# Patient Record
Sex: Female | Born: 1955 | Race: White | Hispanic: No | Marital: Married | Smoking: Former smoker
Health system: Southern US, Community
[De-identification: ages and names within clinical notes are randomized; demographics above are authoritative.]

## PROBLEM LIST (undated history)

## (undated) DIAGNOSIS — K219 Gastro-esophageal reflux disease without esophagitis: Secondary | ICD-10-CM

## (undated) DIAGNOSIS — L299 Pruritus, unspecified: Secondary | ICD-10-CM

## (undated) DIAGNOSIS — H04129 Dry eye syndrome of unspecified lacrimal gland: Secondary | ICD-10-CM

## (undated) DIAGNOSIS — H269 Unspecified cataract: Secondary | ICD-10-CM

## (undated) DIAGNOSIS — G4700 Insomnia, unspecified: Secondary | ICD-10-CM

## (undated) HISTORY — DX: Pruritus, unspecified: L29.9

## (undated) HISTORY — DX: Hemochromatosis, unspecified: E83.119

## (undated) HISTORY — DX: Insomnia, unspecified: G47.00

## (undated) HISTORY — DX: Unspecified cataract: H26.9

## (undated) HISTORY — DX: Gastro-esophageal reflux disease without esophagitis: K21.9

## (undated) HISTORY — DX: Dry eye syndrome of unspecified lacrimal gland: H04.129

## (undated) HISTORY — PX: FIXATION KYPHOPLASTY: SHX860

---
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
## 1995-12-30 HISTORY — PX: KNEE SURGERY: SHX244

## 1998-12-29 HISTORY — PX: ABDOMINAL HYSTERECTOMY: SHX81

## 1999-12-30 HISTORY — PX: OOPHORECTOMY: SHX86

## 2003-12-30 LAB — CONVERTED CEMR LAB

## 2007-10-14 ENCOUNTER — Encounter: Payer: Self-pay | Admitting: Family Medicine

## 2007-10-14 LAB — CONVERTED CEMR LAB
LDL Cholesterol: 102 mg/dL
TSH: 0.011 microintl units/mL

## 2007-11-01 ENCOUNTER — Ambulatory Visit: Payer: Self-pay | Admitting: Family Medicine

## 2007-11-01 DIAGNOSIS — E039 Hypothyroidism, unspecified: Secondary | ICD-10-CM | POA: Insufficient documentation

## 2007-11-02 ENCOUNTER — Encounter: Admission: RE | Admit: 2007-11-02 | Discharge: 2007-11-02 | Payer: Self-pay | Admitting: Family Medicine

## 2007-11-09 ENCOUNTER — Encounter: Payer: Self-pay | Admitting: Family Medicine

## 2007-11-09 DIAGNOSIS — G43109 Migraine with aura, not intractable, without status migrainosus: Secondary | ICD-10-CM | POA: Insufficient documentation

## 2007-11-12 ENCOUNTER — Encounter: Admission: RE | Admit: 2007-11-12 | Discharge: 2007-11-12 | Payer: Self-pay | Admitting: Family Medicine

## 2007-11-12 ENCOUNTER — Ambulatory Visit: Payer: Self-pay | Admitting: Family Medicine

## 2007-11-12 DIAGNOSIS — J441 Chronic obstructive pulmonary disease with (acute) exacerbation: Secondary | ICD-10-CM | POA: Insufficient documentation

## 2007-11-12 DIAGNOSIS — J449 Chronic obstructive pulmonary disease, unspecified: Secondary | ICD-10-CM | POA: Insufficient documentation

## 2007-11-12 DIAGNOSIS — I209 Angina pectoris, unspecified: Secondary | ICD-10-CM

## 2007-11-23 ENCOUNTER — Encounter: Payer: Self-pay | Admitting: Family Medicine

## 2007-11-29 ENCOUNTER — Ambulatory Visit: Payer: Self-pay | Admitting: Family Medicine

## 2007-11-29 DIAGNOSIS — M25519 Pain in unspecified shoulder: Secondary | ICD-10-CM | POA: Insufficient documentation

## 2008-02-02 ENCOUNTER — Ambulatory Visit: Payer: Self-pay | Admitting: Family Medicine

## 2008-02-02 DIAGNOSIS — R7301 Impaired fasting glucose: Secondary | ICD-10-CM

## 2008-02-02 LAB — CONVERTED CEMR LAB
Blood Glucose, Fasting: 105 mg/dL
Microalbumin U total vol: 10 mg/L
Nitrite: NEGATIVE
Urobilinogen, UA: 0.2
pH: 7

## 2008-02-03 ENCOUNTER — Encounter: Payer: Self-pay | Admitting: Family Medicine

## 2008-02-04 LAB — CONVERTED CEMR LAB
Calcium: 9.6 mg/dL (ref 8.4–10.5)
Chloride: 103 meq/L (ref 96–112)
Cholesterol: 212 mg/dL — ABNORMAL HIGH (ref 0–200)
Creatinine, Ser: 0.66 mg/dL (ref 0.40–1.20)
HDL goal, serum: 40 mg/dL
LDL Goal: 70 mg/dL
Potassium: 4.7 meq/L (ref 3.5–5.3)
Sodium: 139 meq/L (ref 135–145)
Total Bilirubin: 0.5 mg/dL (ref 0.3–1.2)
Total CHOL/HDL Ratio: 3.9
Triglycerides: 108 mg/dL (ref <150)

## 2008-02-10 ENCOUNTER — Telehealth (INDEPENDENT_AMBULATORY_CARE_PROVIDER_SITE_OTHER): Payer: Self-pay | Admitting: *Deleted

## 2008-02-10 ENCOUNTER — Encounter: Payer: Self-pay | Admitting: Family Medicine

## 2008-02-15 ENCOUNTER — Ambulatory Visit: Payer: Self-pay | Admitting: Family Medicine

## 2008-03-31 ENCOUNTER — Ambulatory Visit: Payer: Self-pay | Admitting: Family Medicine

## 2008-04-21 ENCOUNTER — Encounter: Payer: Self-pay | Admitting: Family Medicine

## 2008-04-24 ENCOUNTER — Telehealth (INDEPENDENT_AMBULATORY_CARE_PROVIDER_SITE_OTHER): Payer: Self-pay | Admitting: *Deleted

## 2008-04-27 ENCOUNTER — Telehealth: Payer: Self-pay | Admitting: Family Medicine

## 2008-05-04 ENCOUNTER — Encounter: Payer: Self-pay | Admitting: Family Medicine

## 2008-05-15 ENCOUNTER — Ambulatory Visit: Payer: Self-pay | Admitting: Family Medicine

## 2008-05-15 ENCOUNTER — Encounter: Admission: RE | Admit: 2008-05-15 | Discharge: 2008-05-15 | Payer: Self-pay | Admitting: Family Medicine

## 2008-05-15 DIAGNOSIS — F17200 Nicotine dependence, unspecified, uncomplicated: Secondary | ICD-10-CM

## 2008-05-15 DIAGNOSIS — E042 Nontoxic multinodular goiter: Secondary | ICD-10-CM

## 2008-05-16 LAB — CONVERTED CEMR LAB
T3, Free: 3.8 pg/mL (ref 2.3–4.2)
TSH: 0.006 microintl units/mL — ABNORMAL LOW (ref 0.350–5.50)

## 2008-05-24 ENCOUNTER — Encounter: Payer: Self-pay | Admitting: Family Medicine

## 2008-05-25 ENCOUNTER — Ambulatory Visit: Payer: Self-pay | Admitting: Family Medicine

## 2008-05-25 DIAGNOSIS — R498 Other voice and resonance disorders: Secondary | ICD-10-CM | POA: Insufficient documentation

## 2008-05-25 DIAGNOSIS — G4700 Insomnia, unspecified: Secondary | ICD-10-CM | POA: Insufficient documentation

## 2008-06-01 ENCOUNTER — Telehealth: Payer: Self-pay | Admitting: Family Medicine

## 2008-06-02 ENCOUNTER — Telehealth: Payer: Self-pay | Admitting: Family Medicine

## 2008-06-15 ENCOUNTER — Ambulatory Visit: Payer: Self-pay | Admitting: Family Medicine

## 2008-06-15 DIAGNOSIS — M7710 Lateral epicondylitis, unspecified elbow: Secondary | ICD-10-CM | POA: Insufficient documentation

## 2008-06-28 ENCOUNTER — Telehealth: Payer: Self-pay | Admitting: Family Medicine

## 2008-06-29 ENCOUNTER — Telehealth: Payer: Self-pay | Admitting: Family Medicine

## 2008-08-10 ENCOUNTER — Ambulatory Visit: Payer: Self-pay | Admitting: Family Medicine

## 2008-08-14 ENCOUNTER — Telehealth: Payer: Self-pay | Admitting: Family Medicine

## 2008-08-16 ENCOUNTER — Telehealth: Payer: Self-pay | Admitting: Family Medicine

## 2008-08-17 ENCOUNTER — Telehealth: Payer: Self-pay | Admitting: Family Medicine

## 2008-08-21 ENCOUNTER — Telehealth: Payer: Self-pay | Admitting: Family Medicine

## 2008-09-27 ENCOUNTER — Ambulatory Visit: Payer: Self-pay | Admitting: Family Medicine

## 2008-09-27 DIAGNOSIS — R208 Other disturbances of skin sensation: Secondary | ICD-10-CM | POA: Insufficient documentation

## 2008-10-10 ENCOUNTER — Telehealth: Payer: Self-pay | Admitting: Family Medicine

## 2008-10-19 ENCOUNTER — Telehealth: Payer: Self-pay | Admitting: Family Medicine

## 2008-10-20 ENCOUNTER — Telehealth (INDEPENDENT_AMBULATORY_CARE_PROVIDER_SITE_OTHER): Payer: Self-pay | Admitting: Internal Medicine

## 2008-10-26 ENCOUNTER — Telehealth: Payer: Self-pay | Admitting: Family Medicine

## 2008-10-26 ENCOUNTER — Ambulatory Visit: Payer: Self-pay | Admitting: Family Medicine

## 2008-11-16 ENCOUNTER — Telehealth: Payer: Self-pay | Admitting: Family Medicine

## 2008-11-17 ENCOUNTER — Ambulatory Visit: Payer: Self-pay | Admitting: Family Medicine

## 2008-11-22 ENCOUNTER — Telehealth (INDEPENDENT_AMBULATORY_CARE_PROVIDER_SITE_OTHER): Payer: Self-pay | Admitting: *Deleted

## 2008-11-24 ENCOUNTER — Telehealth: Payer: Self-pay | Admitting: Family Medicine

## 2008-11-27 ENCOUNTER — Ambulatory Visit: Payer: Self-pay | Admitting: Family Medicine

## 2008-11-27 ENCOUNTER — Encounter: Admission: RE | Admit: 2008-11-27 | Discharge: 2008-11-27 | Payer: Self-pay | Admitting: Family Medicine

## 2008-11-27 DIAGNOSIS — M545 Low back pain, unspecified: Secondary | ICD-10-CM | POA: Insufficient documentation

## 2008-11-28 LAB — CONVERTED CEMR LAB
Anti Nuclear Antibody(ANA): NEGATIVE
Folate: >20 ng/mL
Hemoglobin: 15.6 g/dL — ABNORMAL HIGH (ref 12.0–15.0)
Platelets: 388 10*3/uL (ref 150–400)
Sed Rate: 9 mm/hr (ref 0–22)
TSH: 0.142 microintl units/mL — ABNORMAL LOW (ref 0.350–4.50)
Vitamin B-12: 820 pg/mL (ref 211–911)

## 2008-12-02 ENCOUNTER — Encounter: Admission: RE | Admit: 2008-12-02 | Discharge: 2008-12-02 | Payer: Self-pay | Admitting: Family Medicine

## 2008-12-29 HISTORY — PX: OTHER SURGICAL HISTORY: SHX169

## 2009-01-04 ENCOUNTER — Encounter: Payer: Self-pay | Admitting: Family Medicine

## 2009-02-01 ENCOUNTER — Encounter: Payer: Self-pay | Admitting: Family Medicine

## 2009-03-01 ENCOUNTER — Encounter: Payer: Self-pay | Admitting: Family Medicine

## 2009-04-12 ENCOUNTER — Encounter: Payer: Self-pay | Admitting: Family Medicine

## 2009-04-23 ENCOUNTER — Ambulatory Visit: Payer: Self-pay | Admitting: Family Medicine

## 2009-04-25 LAB — CONVERTED CEMR LAB: Anti Nuclear Antibody(ANA): NEGATIVE

## 2009-05-22 ENCOUNTER — Telehealth: Payer: Self-pay | Admitting: Family Medicine

## 2009-05-25 ENCOUNTER — Encounter: Payer: Self-pay | Admitting: Family Medicine

## 2009-06-08 ENCOUNTER — Encounter: Payer: Self-pay | Admitting: Family Medicine

## 2010-01-17 ENCOUNTER — Encounter: Payer: Self-pay | Admitting: Family Medicine

## 2010-02-14 ENCOUNTER — Encounter: Payer: Self-pay | Admitting: Family Medicine

## 2010-02-14 ENCOUNTER — Telehealth: Payer: Self-pay | Admitting: Family Medicine

## 2010-02-14 DIAGNOSIS — M2550 Pain in unspecified joint: Secondary | ICD-10-CM

## 2010-02-15 ENCOUNTER — Encounter: Payer: Self-pay | Admitting: Family Medicine

## 2010-02-19 LAB — CONVERTED CEMR LAB
CRP: 0.3 mg/dL (ref <0.6)
Cytomegalovirus Ab-IgG: >10 — ABNORMAL HIGH (ref <0.4)
EBV NA IgG: 2.86 — ABNORMAL HIGH
EBV VCA IgM: 0.04
HCT: 47.4 % — ABNORMAL HIGH (ref 36.0–46.0)
MCHC: 32.3 g/dL (ref 30.0–36.0)
Platelets: 337 10*3/uL (ref 150–400)
RBC: 4.8 M/uL (ref 3.87–5.11)
Rhuematoid fact SerPl-aCnc: <20 intl units/mL (ref 0–20)
WBC: 9 10*3/uL (ref 4.0–10.5)

## 2010-02-21 ENCOUNTER — Ambulatory Visit: Payer: Self-pay | Admitting: Family Medicine

## 2010-02-21 DIAGNOSIS — M79609 Pain in unspecified limb: Secondary | ICD-10-CM

## 2010-02-26 ENCOUNTER — Ambulatory Visit: Payer: Self-pay | Admitting: Family Medicine

## 2010-02-26 DIAGNOSIS — H6690 Otitis media, unspecified, unspecified ear: Secondary | ICD-10-CM | POA: Insufficient documentation

## 2010-03-04 ENCOUNTER — Telehealth: Payer: Self-pay | Admitting: Family Medicine

## 2010-03-07 ENCOUNTER — Telehealth: Payer: Self-pay | Admitting: Family Medicine

## 2010-03-08 ENCOUNTER — Telehealth: Payer: Self-pay | Admitting: Family Medicine

## 2010-03-08 ENCOUNTER — Encounter: Payer: Self-pay | Admitting: Family Medicine

## 2010-03-12 ENCOUNTER — Telehealth: Payer: Self-pay | Admitting: Family Medicine

## 2010-03-28 ENCOUNTER — Encounter: Payer: Self-pay | Admitting: Family Medicine

## 2010-04-11 ENCOUNTER — Encounter: Payer: Self-pay | Admitting: Family Medicine

## 2010-04-25 ENCOUNTER — Telehealth: Payer: Self-pay | Admitting: Family Medicine

## 2010-04-26 ENCOUNTER — Telehealth: Payer: Self-pay | Admitting: Family Medicine

## 2010-04-30 ENCOUNTER — Telehealth: Payer: Self-pay | Admitting: Family Medicine

## 2010-05-06 ENCOUNTER — Encounter: Admission: RE | Admit: 2010-05-06 | Discharge: 2010-05-06 | Payer: Self-pay | Admitting: Specialist

## 2010-05-23 ENCOUNTER — Encounter: Payer: Self-pay | Admitting: Family Medicine

## 2010-07-02 ENCOUNTER — Telehealth: Payer: Self-pay | Admitting: Family Medicine

## 2010-07-03 ENCOUNTER — Telehealth: Payer: Self-pay | Admitting: Family Medicine

## 2010-07-31 ENCOUNTER — Telehealth: Payer: Self-pay | Admitting: Family Medicine

## 2010-08-01 ENCOUNTER — Encounter: Payer: Self-pay | Admitting: Family Medicine

## 2010-09-12 ENCOUNTER — Encounter: Payer: Self-pay | Admitting: Family Medicine

## 2010-10-01 ENCOUNTER — Telehealth (INDEPENDENT_AMBULATORY_CARE_PROVIDER_SITE_OTHER): Payer: Self-pay | Admitting: *Deleted

## 2010-10-24 ENCOUNTER — Ambulatory Visit: Payer: Self-pay | Admitting: Family Medicine

## 2010-11-04 ENCOUNTER — Encounter: Payer: Self-pay | Admitting: Family Medicine

## 2010-11-06 ENCOUNTER — Telehealth: Payer: Self-pay | Admitting: Family Medicine

## 2011-01-19 ENCOUNTER — Encounter: Payer: Self-pay | Admitting: Specialist
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
## 2011-01-28 NOTE — Assessment & Plan Note (Signed)
Summary: LOM   Vital Signs:  Patient profile:   55 year old female Weight:      120.25 pounds BMI:     22.07 Temp:     98.6 degrees F oral Pulse rate:   69 / minute Pulse rhythm:   regular Resp:     16 per minute BP sitting:   124 / 83  (right arm) Cuff size:   regular  Vitals Entered By: Glendell Docker CMA (February 26, 2010 4:21 PM) CC: Left ear pain Comments c/o sudden onset of left ear pain yesterday. She used Auro over the counter ear drops with no relief. She also hs nausea and vertigo.    Primary Care Provider:  Nani Gasser MD  CC:  Left ear pain.  History of Present Illness: Left ear pain, dizziness and nausea.  Used some OTC ear drops and this made it worse. Started yesterday. No fever or other URI sxs.  EAr popped on Sunday and noticed discomfort after that.  Noticed some brown clear drianage today.  When put drops in  got vertigo.    Allergies (verified): 1)  ! Erythromycin  Physical Exam  General:  Well-developed,well-nourished,in no acute distress; alert,appropriate and cooperative throughout examination Head:  Normocephalic and atraumatic without obvious abnormalities. No apparent alopecia or balding. Eyes:  No corneal or conjunctival inflammation noted. EOMI. Perrla. Ears:  Right TM is clear. Left TM is ertythematous and retracted. No active drainage noticed.  Mouth:  Oral mucosa and oropharynx without lesions or exudates.  Teeth in good repair. Neck:  No deformities, masses, or tenderness noted. Skin:  no rashes.   Cervical Nodes:  No lymphadenopathy noted Psych:  Cognition and judgment appear intact. Alert and cooperative with normal attention span and concentration. No apparent delusions, illusions, hallucinations   Impression & Recommendations:  Problem # 1:  LOM (ICD-382.9)  Her updated medication list for this problem includes:    Augmentin 875-125 Mg Tabs (Amoxicillin-pot clavulanate) ..... Take 1 tablet by mouth two times a day for 10  days  Instructed on prevention and treatment. Call if no improvement in 48-72 hours or sooner if worsening symptoms.   Complete Medication List: 1)  Levoxyl 25 Mcg Tabs (Levothyroxine sodium) .... Take 1 tablet by mouth once a day 2)  Hydroxyzine Hcl 50 Mg Tabs (Hydroxyzine hcl) .... Take 1 tablet by mouth once a day at bedtime as needed 3)  Augmentin 875-125 Mg Tabs (Amoxicillin-pot clavulanate) .... Take 1 tablet by mouth two times a day for 10 days Prescriptions: AUGMENTIN 875-125 MG TABS (AMOXICILLIN-POT CLAVULANATE) Take 1 tablet by mouth two times a day for 10 days  #20 x 0   Entered and Authorized by:   Catherine Metheney MD   Signed by:   Catherine Metheney MD on 02/26/2010   Method used:   Electronically to        [facility] [address]* (retail)       [address]       [place], [place]  [address]       Ph: [phone]       Fax: [phone]   RxID:   [id number]   Current Allergies (reviewed today): ! ERYTHROMYCIN

## 2011-01-28 NOTE — Letter (Signed)
Summary: [facility]  [facility]   Imported By: Lanelle Bal 04/16/2010 11:48:51  _____________________________________________________________________  External Attachment:    Type:   Image     Comment:   External Document

## 2011-01-28 NOTE — Letter (Signed)
Summary: [facility]  [facility]   Imported By: Lanelle Bal 03/21/2010 10:07:17  _____________________________________________________________________  External Attachment:    Type:   Image     Comment:   External Document

## 2011-01-28 NOTE — Consult Note (Signed)
Summary: [facility]  [facility]   Imported By: Lanelle Bal 04/25/2010 09:35:08  _____________________________________________________________________  External Attachment:    Type:   Image     Comment:   External Document

## 2011-01-28 NOTE — Letter (Signed)
Summary: [facility]  [facility]   Imported By: Lanelle Bal 06/13/2010 11:36:31  _____________________________________________________________________  External Attachment:    Type:   Image     Comment:   External Document

## 2011-01-28 NOTE — Progress Notes (Signed)
Summary: Slleping med change  Phone Note Call from Patient   Summary of Call: Pt was taking the xanax and has never been on the hydroxyzine. Wanted you to call in higher dose of the Xanax since she was taking 1 1/2. Pt will try the Hydroxyzine and if no help will call on Monday Initial call taken by: Kathlene November,  April 26, 2010 10:48 AM

## 2011-01-28 NOTE — Progress Notes (Signed)
Summary: Sleep  Phone Note Call from Patient Call back at Home Phone [phone]   Caller: Patient Call For: Nani Gasser MD Summary of Call: Pt calls and states that the hydroxyzine is not helping at all tried it for 4 days nad not sleeping. Would like to have the Xanax back but just at higher dose cause she was taking 1 1/2 of these and they worked great for her to sleep. Initial call taken by: Kathlene November,  Apr 30, 2010 8:10 AM  Follow-up for Phone Call        Will inc to 2 mg but this is the max dose.  Can adjust dose down if doing better. Or can come in to discuss other sleep aid options.  Follow-up by: Nani Gasser MD,  Apr 30, 2010 8:15 AM  Additional Follow-up for Phone Call Additional follow up Details #1::        Pt notified. Med faxed to [facility]. Additional Follow-up by: Kathlene November,  Apr 30, 2010 8:19 AM    New/Updated Medications: ALPRAZOLAM 2 MG TABS (ALPRAZOLAM) Take 1 tablet by mouth once a day at bedtime as needed. Prescriptions: ALPRAZOLAM 2 MG TABS (ALPRAZOLAM) Take 1 tablet by mouth once a day at bedtime as needed.  #30 x 1   Entered and Authorized by:   Nani Gasser MD   Signed by:   Nani Gasser MD on 04/30/2010   Method used:   Printed then faxed to ...       [facility] (retail)       [address] [place], [place]  [address]       Ph: [phone]       Fax: [phone]   RxID:   [id number]

## 2011-01-28 NOTE — Progress Notes (Signed)
Summary: RX's already sent  Phone Note Refill Request Message from:  Fax from Pharmacy on July 02, 2010 2:56 PM  Refills Requested: Medication #1:  LEVOXYL 25 MCG TABS Take 1 tablet by mouth once a day   Dosage confirmed as above?Dosage Confirmed   Brand Name Necessary? No   Supply Requested: 1 month   Last Refilled: 06/01/2010  Medication #2:  ALPRAZOLAM 2 MG TABS Take 1 tablet by mouth once a day at bedtime as needed..   Dosage confirmed as above?Dosage Confirmed   Brand Name Necessary? No   Supply Requested: 1 month   Last Refilled: 06/01/2010 Initial call taken by: Fabienne Bruns,  July 02, 2010 2:57 PM    RX's sent on 07-03-2010 Seymour Bars, D.O.

## 2011-01-28 NOTE — Progress Notes (Signed)
Summary: Medication Refills  Phone Note Refill Request Message from:  Fax from Pharmacy on July 31, 2010 1:57 PM  Refills Requested: Medication #1:  LEVOXYL 25 MCG TABS Take 1 tablet by mouth once a day   Dosage confirmed as above?Dosage Confirmed   Brand Name Necessary? No   Supply Requested: 1 month   Last Refilled: 07/31/2010  Medication #2:  ALPRAZOLAM 2 MG TABS Take 1 tablet by mouth once a day at bedtime as needed..   Dosage confirmed as above?Dosage Confirmed   Brand Name Necessary? No   Supply Requested: 1 month   Last Refilled: 07/03/2010  Method Requested: Telephone to Pharmacy Next Appointment Scheduled: No future appointments on file Initial call taken by: Glendell Docker CMA,  July 31, 2010 1:58 PM  Follow-up for Phone Call        OK to telephone in as below, but pt needs f/u this month.  Follow-up by: Nani Gasser MD,  July 31, 2010 5:12 PM    Prescriptions: LEVOXYL 25 MCG TABS (LEVOTHYROXINE SODIUM) Take 1 tablet by mouth once a day  #30 x 1   Entered by:   Kathlene November   Authorized by:   Nani Gasser MD   Signed by:   Kathlene November on 08/01/2010   Method used:   Print then Give to Patient   RxID:   [id number] ALPRAZOLAM 2 MG TABS (ALPRAZOLAM) Take 1 tablet by mouth once a day at bedtime as needed.  #30 x 0   Entered by:   Kathlene November   Authorized by:   Nani Gasser MD   Signed by:   Kathlene November on 08/01/2010   Method used:   Print then Give to Patient   RxID:   [id number] ALPRAZOLAM 2 MG TABS (ALPRAZOLAM) Take 1 tablet by mouth once a day at bedtime as needed.  #30 x 0   Entered and Authorized by:   Nani Gasser MD   Signed by:   Nani Gasser MD on 07/31/2010   Method used:   Telephoned to ...       [facility] ([facility]ail)       [address] [place], [place]  [address]       Ph: [phone]       Fax: [phone]   RxID:   [id number] LEVOXYL 25 MCG TABS (LEVOTHYROXINE SODIUM) Take 1 tablet  by mouth once a day  #30 x 1   Entered and Authorized by:   Nani Gasser MD   Signed by:   Nani Gasser MD on 07/31/2010   Method used:   Telephoned to ...       [facility] ([facility]ail)       [address] [place], [place]  [address]       Ph: [phone]       Fax: [phone]   RxID:   [id number]

## 2011-01-28 NOTE — Progress Notes (Signed)
Summary: lab test  Phone Note Call from Patient   Summary of Call: Hi Kim, pt will be going to the lab tomorrow, she wants to make sure they will be checking her thyroid level, pt is not expecting a call back, thanks, Diane Initial call taken by: Kathlene November,  February 14, 2010 2:12 PM  Follow-up for Phone Call        labs faxed to [facility] Follow-up by: Kathlene November,  February 14, 2010 2:12 PM

## 2011-01-28 NOTE — Letter (Signed)
Summary: [facility]  [facility]   Imported By: Lanelle Bal 10/18/2010 15:14:52  _____________________________________________________________________  External Attachment:    Type:   Image     Comment:   External Document

## 2011-01-28 NOTE — Progress Notes (Signed)
Summary: sleeping pill  Phone Note Call from Patient Call back at Home Phone [phone]   Caller: Patient Call For: Nani Gasser MD Summary of Call: Pt states you told her she could take 2 of her sleeping pills at night. Pt has been taking 1 1/2 tabs and this is working fine and wanted to know if you would send in the increased dose to her pharmacy Initial call taken by: Kathlene November,  April 25, 2010 3:55 PM  Follow-up for Phone Call        New rx sent.  Follow-up by: Nani Gasser MD,  April 25, 2010 4:50 PM  Additional Follow-up for Phone Call Additional follow up Details #1::        Pt notifeid. KJ LPN Additional Follow-up by: Kathlene November,  April 25, 2010 4:53 PM    New/Updated Medications: HYDROXYZINE HCL 50 MG TABS (HYDROXYZINE HCL) Take 1-2  tablet by mouth once a day at bedtime as needed Prescriptions: HYDROXYZINE HCL 50 MG TABS (HYDROXYZINE HCL) Take 1-2  tablet by mouth once a day at bedtime as needed  #60 x 2   Entered and Authorized by:   Nani Gasser MD   Signed by:   Nani Gasser MD on 04/25/2010   Method used:   Electronically to        [facility] (retail)       [address] [place], [place]  [address]       Ph: [phone]       Fax: [phone]   RxID:   [id number]

## 2011-01-28 NOTE — Progress Notes (Signed)
  Phone Note Refill Request Message from:  Fax from Pharmacy on October 01, 2010 2:01 PM  Refills Requested: Medication #1:  ALPRAZOLAM 2 MG TABS Take 1 tablet by mouth once a day at bedtime as needed.. rx denied. rx just filled 08/29/10 with one refill.will fax request back to pharm letting them know rx denied  Initial call taken by: Avon Gully CMA, Duncan Dull),  October 01, 2010 2:01 PM

## 2011-01-28 NOTE — Op Note (Signed)
Summary: [facility]  [facility]   Imported By: Lanelle Bal 03/21/2010 10:04:44  _____________________________________________________________________  External Attachment:    Type:   Image     Comment:   External Document
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
## 2011-01-28 NOTE — Progress Notes (Signed)
Summary: ENT appt  ---- Converted from flag ---- ---- 03/08/2010 9:03 AM, Kathlene November wrote:   ---- 03/08/2010 8:50 AM, Darral Dash wrote:    Called patient-- [facility] called her to schedule appt for Monday,and told her she would need hearing test.  she is very upset and does not want appt.  I ask her if she wanted appt in [place] , she refused all appointments  ---- 03/08/2010 8:45 AM, Kathlene November wrote: Thanks I appreciate itSelena Batten  ---- 03/08/2010 8:39 AM, Darral Dash wrote:     [facility] in [place] was trying to work her  in today  it had to go to triage, I will call them back .     Helen  ---- 03/08/2010 8:17 AM, Kathlene November wrote: Peri Jefferson morning Myriam Jacobson- Patient Zahlia Deshazer you done a referral to ENT for her and she says its not till March- can you call ENT and see if they can get her in sooner because she has pain in the ear- draining.  Thanks ------------------------------    Called and spoke w/ Dr. Clearance Coots at [facility] in [place]. He will fit her in this AM. March 11, 201111:44 AM Linford Arnold MD, Santina Evans

## 2011-01-28 NOTE — Letter (Signed)
Summary: [facility]  [facility]   Imported By: Lanelle Bal 08/20/2010 11:37:36  _____________________________________________________________________  External Attachment:    Type:   Image     Comment:   External Document

## 2011-01-28 NOTE — Progress Notes (Signed)
Summary: Ear pain  Phone Note Outgoing Call   Summary of Call: Call pt: If ear is not better lets scheedule wiht ENT.  If she is OK with that let see if they can see her tomorrow.  Initial call taken by: Nani Gasser MD,  March 07, 2010 11:41 AM  Follow-up for Phone Call        Still draining and alot of pain. Please send referral to [facility] and label ASAP Follow-up by: Kathlene November,  March 07, 2010 11:48 AM

## 2011-01-28 NOTE — Letter (Signed)
Summary: Thoracic Surgeon Note  Thoracic Surgeon Note   Imported By: Kathlene November 05/16/2008 09:53:26  _____________________________________________________________________  External Attachment:    Type:   Image     Comment:   External Document

## 2011-01-28 NOTE — Letter (Signed)
Summary: [facility]  [facility]   Imported By: Lanelle Bal 03/05/2010 09:16:01  _____________________________________________________________________  External Attachment:    Type:   Image     Comment:   External Document

## 2011-01-28 NOTE — Progress Notes (Signed)
Summary: Labs  Phone Note Outgoing Call   Summary of Call: Call pt: Labs show pre-diabetic range. Eat healthy, walking for excercise when able.  Recheck in 6 months. liver, kdineys and thyroid are normal.   Initial call taken by: Nani Gasser MD,  November 06, 2010 2:26 PM  Follow-up for Phone Call        pt notified Follow-up by: Avon Gully CMA, Duncan Dull),  November 07, 2010 8:17 AM

## 2011-01-28 NOTE — Progress Notes (Signed)
Summary: ear pain  Phone Note Call from Patient Call back at Home Phone [phone]   Caller: Patient Call For: Nani Gasser MD Summary of Call: Seen Tuesday for her ear- doesn't feel like the Augmentin working- still hurting, draining. Tuesday- blood from nostril and ear. Uses [facility] in [place].  Follow-up for Phone Call        Lets add a zpack but continue the augmentin.  Follow-up by: Nani Gasser MD,  March 04, 2010 11:08 AM  Additional Follow-up for Phone Call Additional follow up Details #1::        Pt notified med sent to pharmacy Additional Follow-up by: Kathlene November,  March 04, 2010 11:10 AM    New/Updated Medications: ZITHROMAX Z-PAK 250 MG TABS (AZITHROMYCIN) Take as directed. Prescriptions: ZITHROMAX Z-PAK 250 MG TABS (AZITHROMYCIN) Take as directed.  #1 pack x 0   Entered and Authorized by:   Nani Gasser MD   Signed by:   Nani Gasser MD on 03/04/2010   Method used:   Electronically to        [facility] (retail)       [address] [place], [place]  [address]       Ph: [phone]       Fax: [phone]   RxID:   [id number]

## 2011-01-28 NOTE — Letter (Signed)
Summary: [facility]  [facility]   Imported By: Lanelle Bal 04/25/2010 11:51:54  _____________________________________________________________________  External Attachment:    Type:   Image     Comment:   External Document

## 2011-01-28 NOTE — Progress Notes (Signed)
Summary: refill request  Phone Note Refill Request Message from:  Patient on July 03, 2010 3:30 PM  Refills Requested: Medication #1:  LEVOXYL 25 MCG TABS Take 1 tablet by mouth once a day  Medication #2:  ALPRAZOLAM 2 MG TABS Take 1 tablet by mouth once a day at bedtime as needed.Marland Kitchen PLease call into [facility] [employer]...   Initial call taken by: Michaelle Copas,  July 03, 2010 3:31 PM  Follow-up for Phone Call        RFs done.  Schedule OV with Dr Linford Arnold in 6 wks for f/u anxiety/ thyroid. Follow-up by: Seymour Bars DO,  July 03, 2010 3:52 PM    Prescriptions: ALPRAZOLAM 2 MG TABS (ALPRAZOLAM) Take 1 tablet by mouth once a day at bedtime as needed.  #30 x 0   Entered and Authorized by:   Seymour Bars DO   Signed by:   Seymour Bars DO on 07/03/2010   Method used:   Printed then faxed to ...       [facility] (ret[facility])       [address] [place], [place]  [address]       Ph: [phone]       Fax: [phone]   RxID:   [id number] LEVOXYL 25 MCG TABS (LEVOTHYROXINE SODIUM) Take 1 tablet by mouth once a day  #30 x 1   Entered and Authorized by:   Seymour Bars DO   Signed by:   Seymour Bars DO on 07/03/2010   Method used:   Printed then faxed to ...       [facility] (retail)       [address] [place], [place]  [address]       Ph: [phone]       Fax: [phone]   RxID:   [id number]   Appended Document: refill request Rxs faxed to [facility]. Notified pt.

## 2011-01-28 NOTE — Progress Notes (Signed)
Summary: Call from Dr. [facility]  Phone Note Outgoing Call   Summary of Call: Spoke with Dr. Loleta Chance who recommends a more rheum workup.  Will get labs and refer to rhuem. Mostly having joint aches in her elbows, shoulders, and her neck. Feel it may be collagen vasc vs a true neuropathy.  Initial call taken by: Nani Gasser MD,  February 14, 2010 1:07 PM  Follow-up for Phone Call        Will print lab slip and send downstairs.  Follow-up by: Nani Gasser MD,  February 14, 2010 1:10 PM  New Problems: POLYARTHRITIS [id number])   New Problems: POLYARTHRITIS (ICD-719.49)

## 2011-02-07 ENCOUNTER — Telehealth: Payer: Self-pay | Admitting: Family Medicine

## 2011-02-13 NOTE — Progress Notes (Signed)
  Phone Note Refill Request Message from:  Fax from Pharmacy on February 07, 2011 10:41 AM  Refills Requested: Medication #1:  ALPRAZOLAM 2 MG TABS Take 1 tablet by mouth once a day at bedtime as needed. Initial call taken by: Avon Gully CMA, Duncan Dull),  February 07, 2011 10:41 AM    Prescriptions: ALPRAZOLAM 2 MG TABS (ALPRAZOLAM) Take 1 tablet by mouth once a day at bedtime as needed.  #30 x 2   Entered by:   Avon Gully CMA, (AAMA)   Authorized by:   Nani Gasser MD   Signed by:   Avon Gully CMA, (AAMA) on 02/07/2011   Method used:   Printed then faxed to ...       [facility] ([facility])       [address] [place], [place]  [address]       Ph: [phone]       Fax: [phone]   RxID:   [id number]   Appended Document:  RC to pt, refill faxed earlier today.

## 2011-02-20 ENCOUNTER — Other Ambulatory Visit: Payer: Self-pay | Admitting: Neurosurgery

## 2011-02-20 ENCOUNTER — Ambulatory Visit
Admission: RE | Admit: 2011-02-20 | Discharge: 2011-02-20 | Disposition: A | Discharge status: Home / Self Care | Payer: Self-pay | Source: Ambulatory Visit | Attending: Neurosurgery | Admitting: Neurosurgery

## 2011-02-20 DIAGNOSIS — M542 Cervicalgia: Secondary | ICD-10-CM

## 2011-04-09 ENCOUNTER — Telehealth: Payer: Self-pay | Admitting: Family Medicine

## 2011-04-09 MED ORDER — ALPRAZOLAM 1 MG PO TABS: 1.0000 mg | ORAL_TABLET | Freq: Every evening | ORAL | Status: DC | PRN

## 2011-04-09 NOTE — Telephone Encounter (Signed)
Patient called and a little upset because she states she has left multiple messages on the triage line and nobody has returned her phone call. She called last week and left a message because she had a question after she had a fall and ended up going to a urgent care. Now patient is out of her regular daily meds... Patient also states that her Pharmacy has sent request over for refills and they are getting no response.  Thyroid medicine- 0.25mg  Synthroid and Alprazolam is what is she is needing as soon as possible. Patient would greatly appreciate a phone call back to touch base with what is going on [phone] ThanksVictorino Dike 04-09-11

## 2011-04-09 NOTE — Telephone Encounter (Signed)
Faxed xanax and escribed synthroid and notified pt and she needs a f/u

## 2011-04-23 ENCOUNTER — Telehealth: Payer: Self-pay | Admitting: Family Medicine

## 2011-04-23 MED ORDER — ALPRAZOLAM 2 MG PO TABS: 2.0000 mg | ORAL_TABLET | Freq: Every evening | ORAL | Status: AC | PRN

## 2011-04-23 NOTE — Telephone Encounter (Signed)
Pt.notified

## 2011-04-23 NOTE — Telephone Encounter (Signed)
Patient walked into office this morning advised that she was prescribed ALPRAZOLAM 1mg  and it should of been 2mg   Patient said she had to double up on her tablets since it was the strength. She states that she called her pharmacy to send a refill request and she has not heard anything so she came into the office . Patient states she needs refill called in today 30 tablets 2mg s for dosage

## 2011-04-23 NOTE — Telephone Encounter (Signed)
Please let pt know to check her pharm late today. Will fax ove rnew rx.

## 2011-04-30 ENCOUNTER — Encounter: Payer: Self-pay | Admitting: Family Medicine

## 2011-04-30 ENCOUNTER — Ambulatory Visit: Payer: 59 | Admitting: Family Medicine

## 2011-05-15 ENCOUNTER — Ambulatory Visit (INDEPENDENT_AMBULATORY_CARE_PROVIDER_SITE_OTHER): Payer: 59 | Admitting: Family Medicine

## 2011-05-15 ENCOUNTER — Other Ambulatory Visit: Payer: Self-pay | Admitting: Family Medicine

## 2011-05-15 ENCOUNTER — Encounter: Payer: Self-pay | Admitting: Family Medicine

## 2011-05-15 DIAGNOSIS — Z0000 Encounter for general adult medical examination without abnormal findings: Secondary | ICD-10-CM

## 2011-05-15 DIAGNOSIS — E88819 Insulin resistance, unspecified: Secondary | ICD-10-CM

## 2011-05-15 DIAGNOSIS — E119 Type 2 diabetes mellitus without complications: Secondary | ICD-10-CM

## 2011-05-15 DIAGNOSIS — E8881 Metabolic syndrome: Secondary | ICD-10-CM

## 2011-05-15 DIAGNOSIS — E039 Hypothyroidism, unspecified: Secondary | ICD-10-CM

## 2011-05-15 MED ORDER — PREGABALIN 50 MG PO CAPS: 50.0000 mg | ORAL_CAPSULE | Freq: Three times a day (TID) | ORAL | Status: AC

## 2011-05-15 MED ORDER — PREGABALIN 50 MG PO CAPS: 50.0000 mg | ORAL_CAPSULE | Freq: Three times a day (TID) | ORAL | Status: DC

## 2011-05-15 MED ORDER — LEVOTHYROXINE SODIUM 25 MCG PO TABS: 25.0000 ug | ORAL_TABLET | Freq: Every day | ORAL | Status: DC

## 2011-05-15 NOTE — Assessment & Plan Note (Signed)
We are due to recheck her thyroid gland as well.

## 2011-05-15 NOTE — Progress Notes (Signed)
Subjective:     Ann Guerrero is a 55 y.o. female and is here for a comprehensive physical exam. The patient reports problems - Still having a lot of pain in her neck and hips. Still having burning in her feet esp at night. .[name] frustrated because she has seen so many specialists and feel she has been running in circles. Says her hands are getting wose and they told her she was not a good candidate for repeat surgery. She does not currently take any pain medications except for some ibuprofen or Tylenol. She feels like taking a pill isn't the answer she wants to know why she is having the symptoms that she is having. She has seen orthopedist here she has seen a neurologist and she has seen the surgeon.  History   Social History  . Marital Status: Married    Spouse Name: Loraine Leriche    Number of Children: 2  . Years of Education: N/A   Occupational History  . Not on file.   Social History Main Topics  . Smoking status: Current Everyday Smoker  . Smokeless tobacco: Not on file  . Alcohol Use: No  . Drug Use: No  . Sexually Active:    Other Topics Concern  . Not on file   Social History Narrative   Regular exercise-yesSelf-employed   Health Maintenance  Topic Date Due  . Tetanus/tdap  10/16/1973  . Mammogram  10/16/2004  . Colonoscopy  10/16/2004  . Influenza Vaccine  09/29/2011    The following portions of the patient's history were reviewed and updated as appropriate: allergies, current medications, past family history, past medical history, past social history and past surgical history.  Review of Systems Pertinent items are noted in HPI.   Objective:    BP 103/68  Pulse 74  Ht 5\' 1"  (1.549 m)  Wt 105 lb (47.628 kg)  BMI 19.84 kg/m2 General appearance: alert, cooperative and appears stated age Head: Normocephalic, without obvious abnormality, atraumatic Eyes: conjunctiva are clear.  EOMi, PERRLA.  Ears: normal TM's and external ear canals both ears Nose: Nares  normal. Septum midline. Mucosa normal. No drainage or sinus tenderness. Throat: lips, mucosa, and tongue normal; teeth and gums normal Neck: no adenopathy, no carotid bruit, supple, symmetrical, trachea midline and thyroid not enlarged, symmetric, no tenderness/mass/nodules Back: symmetric, no curvature. ROM normal. No CVA tenderness. Lungs: clear to auscultation bilaterally Breasts: normal appearance, no masses or tenderness Heart: regular rate and rhythm, S1, S2 normal, no murmur, click, rub or gallop Abdomen: soft, non-tender; bowel sounds normal; no masses,  no organomegaly Extremities: extremities normal, atraumatic, no cyanosis or edema Pulses: 2+ and symmetric Skin: Skin color, texture, turgor normal. No rashes or lesions Lymph nodes: Cervical, supraclavicular, and axillary nodes normal. Neurologic: Grossly normal    Assessment:    Healthy female exam.      Plan:     See After Visit Summary for Counseling Recommendations  I gave her a lab slip to do her screening labs. She did not declined to schedule a colonoscopy or a mammogram. I think she has a very hopeless attitude on life right now. We did discuss a trial of Lyrica if she is willing to take it out to see if it helps with her neuropathy. It was also felt at one time that she could have fibromyalgia that she's never been specifically diagnosed with that. The Lyrica could potentially help with that as well. She decided she would like to try it for  one month. I started her with a low dose. If it is working well then followup in 6 weeks.

## 2011-05-15 NOTE — Patient Instructions (Signed)
If you feel the lyrica is helping then let me know and followup in 2 months.  We will call you with your lab results Let me know if you change your mind about getting a mammogram and colonoscopy.

## 2011-05-15 NOTE — Assessment & Plan Note (Signed)
A1C was 6.0. This is fantastic. Recheck in 6-12 months.

## 2011-05-16 LAB — LIPID PANEL
Cholesterol: 177 mg/dL (ref 0–200)
HDL: 55 mg/dL (ref >39)

## 2011-05-16 LAB — COMPLETE METABOLIC PANEL WITH GFR
ALT: 10 U/L (ref 0–35)
AST: 16 U/L (ref 0–37)
Albumin: 4.3 g/dL (ref 3.5–5.2)
BUN: 12 mg/dL (ref 6–23)
Calcium: 8.9 mg/dL (ref 8.4–10.5)
GFR, Est African American: >60 mL/min (ref >60)
Glucose, Bld: 99 mg/dL (ref 70–99)
Total Bilirubin: 0.7 mg/dL (ref 0.3–1.2)
Total Protein: 6.5 g/dL (ref 6.0–8.3)

## 2011-05-16 LAB — TSH: TSH: 0.952 u[IU]/mL (ref 0.350–4.500)

## 2011-05-16 LAB — VITAMIN D 25 HYDROXY (VIT D DEFICIENCY, FRACTURES): Vit D, 25-Hydroxy: 15 ng/mL — ABNORMAL LOW (ref 30–89)

## 2011-05-18 ENCOUNTER — Telehealth: Payer: Self-pay | Admitting: Family Medicine

## 2011-05-18 DIAGNOSIS — E559 Vitamin D deficiency, unspecified: Secondary | ICD-10-CM | POA: Insufficient documentation

## 2011-05-18 NOTE — Telephone Encounter (Signed)
Call pt: CMP is normal. LDL chol looks much better this year. Almost at goal. Thyroid is nl.  Vitamin D is really low. Start vitamin D 2000iu daily and lets recheck level in 3 months.

## 2011-05-19 NOTE — Telephone Encounter (Signed)
Pt.notified

## 2011-07-24 ENCOUNTER — Other Ambulatory Visit: Payer: Self-pay | Admitting: Family Medicine

## 2011-07-24 MED ORDER — ALPRAZOLAM 2 MG PO TABS: 2.0000 mg | ORAL_TABLET | Freq: Every evening | ORAL | Status: DC | PRN

## 2011-07-24 NOTE — Telephone Encounter (Addendum)
Request made for refill of alprazolam 2 mg. #30/0 refills  Given.  Recent office visit noted. [facility]vis Newcomer, LPN Domingo Dimes

## 2011-08-25 ENCOUNTER — Other Ambulatory Visit: Payer: Self-pay | Admitting: Family Medicine

## 2011-08-25 MED ORDER — ALPRAZOLAM 2 MG PO TABS: 2.0000 mg | ORAL_TABLET | Freq: Every evening | ORAL | Status: DC | PRN

## 2011-08-25 NOTE — Telephone Encounter (Signed)
Pt calling for refill of her alprazolam 2 mg prescription.  Pt is due for refill since last refilled on 07-24-11.  Was going to refill and send, but pt is asking for more than refill.  Told the pt this is not normal protocol since a benzodiazepine, but pt said the provider had given more than one refill in the past.  Please advise. Plan:  Routed to Dr. Linford Arnold. [facility]vis Newcomer, LPN Domingo Dimes

## 2011-08-25 NOTE — Telephone Encounter (Signed)
OK.  Printed 

## 2011-08-26 ENCOUNTER — Other Ambulatory Visit: Payer: Self-pay | Admitting: Family Medicine

## 2011-08-26 NOTE — Telephone Encounter (Addendum)
Pt called in and said she had spoken to the nurse yesterday regarding refill for her xanex 2 mg.  It was suppose to have been sent yesterday.  Pt upset that the pharm ([facility]) did not have her medication when she went to pick it up last night. Plan:   Reviewed pt chart and the medication was sent by Dr. Linford Arnold herself because pt had requested refills.  Dr. Linford Arnold handled because she does allow this pt to receive refills because she has never abused the medication.  Pt informed will call the pharm as soon as they open up this morning and find out if they have her medication. Jarvis Newcomer, LPN Domingo Dimes  16:10RU 08-26-11 Contacted Vicky at [facility] and inquired about the xanex 2 mg script.  They did not receive Rx yesterday so a verbal was given as it was entered electronically.  Pt informed that they will expedite filling her script and she can go pup today. Jarvis Newcomer, LPN Domingo Dimes

## 2011-10-09 ENCOUNTER — Telehealth: Payer: Self-pay | Admitting: Family Medicine

## 2011-10-09 NOTE — Telephone Encounter (Signed)
Pt called concerned about her recent steroidal shot she received at [facility].  Pt states she doesn't feel well. Plan:  Pt. Informed to contact that office to verify the lots #'s of their steroidal medications is not been pulled.  Pt to discuss with them about her symptoms. Jarvis Newcomer, LPN Domingo Dimes

## 2011-11-13 ENCOUNTER — Emergency Department
Admission: EM | Admit: 2011-11-13 | Discharge: 2011-11-13 | Discharge status: Absent without leave | Payer: 59 | Source: Home / Self Care

## 2011-11-18 ENCOUNTER — Encounter: Payer: Self-pay | Admitting: Family Medicine

## 2011-11-18 ENCOUNTER — Ambulatory Visit (INDEPENDENT_AMBULATORY_CARE_PROVIDER_SITE_OTHER): Payer: 59 | Admitting: Family Medicine

## 2011-11-18 ENCOUNTER — Ambulatory Visit
Admission: RE | Admit: 2011-11-18 | Discharge: 2011-11-18 | Disposition: A | Discharge status: Home / Self Care | Payer: 59 | Source: Ambulatory Visit | Attending: Family Medicine | Admitting: Family Medicine

## 2011-11-18 VITALS — BP 104/79 | HR 97 | Wt 105.0 lb

## 2011-11-18 DIAGNOSIS — G8929 Other chronic pain: Secondary | ICD-10-CM

## 2011-11-18 DIAGNOSIS — M79673 Pain in unspecified foot: Secondary | ICD-10-CM

## 2011-11-18 DIAGNOSIS — R110 Nausea: Secondary | ICD-10-CM

## 2011-11-18 DIAGNOSIS — T148XXA Other injury of unspecified body region, initial encounter: Secondary | ICD-10-CM

## 2011-11-18 DIAGNOSIS — E559 Vitamin D deficiency, unspecified: Secondary | ICD-10-CM

## 2011-11-18 DIAGNOSIS — IMO0002 Reserved for concepts with insufficient information to code with codable children: Secondary | ICD-10-CM

## 2011-11-18 DIAGNOSIS — M79609 Pain in unspecified limb: Secondary | ICD-10-CM

## 2011-11-18 NOTE — Patient Instructions (Signed)
I want to do some reading to see recommended treatments for central nausea.  I want you to start fluoxetine 10mg  for your mood and see me back in 3-4 weeks.  Will call you with the xray results as soon as we get those back.
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
## 2011-11-19 ENCOUNTER — Ambulatory Visit (INDEPENDENT_AMBULATORY_CARE_PROVIDER_SITE_OTHER): Payer: 59 | Admitting: Family Medicine

## 2011-11-19 DIAGNOSIS — S92309A Fracture of unspecified metatarsal bone(s), unspecified foot, initial encounter for closed fracture: Secondary | ICD-10-CM

## 2011-11-19 NOTE — Progress Notes (Signed)
Here for postop shoe for fourth metatarsal fracture.

## 2011-11-28 ENCOUNTER — Other Ambulatory Visit: Payer: Self-pay | Admitting: *Deleted

## 2011-11-28 LAB — BASIC METABOLIC PANEL
BUN: 11 mg/dL (ref 4–21)
Creatinine: 0.7 mg/dL (ref 0.5–1.1)
Glucose: 84 mg/dL
Potassium: 4.4 mmol/L (ref 3.4–5.3)
Sodium: 139 mmol/L (ref 137–147)

## 2011-11-28 LAB — TSH: TSH: 1.16 u[IU]/mL (ref 0.41–5.90)

## 2011-11-28 LAB — HEPATIC FUNCTION PANEL: Alkaline Phosphatase: 67 U/L (ref 25–125)

## 2011-11-28 MED ORDER — LEVOTHYROXINE SODIUM 25 MCG PO TABS: 25.0000 ug | ORAL_TABLET | Freq: Every day | ORAL | Status: DC

## 2011-12-01 ENCOUNTER — Encounter: Payer: Self-pay | Admitting: Family Medicine

## 2011-12-01 ENCOUNTER — Telehealth: Payer: Self-pay | Admitting: Family Medicine

## 2011-12-01 MED ORDER — ALENDRONATE SODIUM 70 MG PO TABS: 70.0000 mg | ORAL_TABLET | ORAL | Status: AC

## 2011-12-01 NOTE — Telephone Encounter (Signed)
Call pt: Bone density shows she has osteoporosis. Thin bones. This is not a painful process.  REcommend a bisphosphoate for the osteoporosis. Usually use alendronate once a week, which comes generic. Generic for fosamax for this. Then repeat density test in 2 years.

## 2011-12-01 NOTE — Telephone Encounter (Signed)
Pt aware.

## 2011-12-02 ENCOUNTER — Telehealth: Payer: Self-pay | Admitting: Family Medicine

## 2011-12-02 NOTE — Telephone Encounter (Signed)
Call patient: Her cholesterol overall looks good. Her LDL was 108. Normal is under 100 so she is on is to goal. Her complete metabolic panel was normal. Her vitamin D was very low at 9. I would like her to start vitamin D 2000 international units once a day and then let's recheck her level in 3 months. This can contribute to bone pain if she is deficient.

## 2011-12-03 NOTE — Telephone Encounter (Signed)
Pt notified of MD instructions and results. KJ LPN

## 2011-12-04 ENCOUNTER — Encounter: Payer: Self-pay | Admitting: Family Medicine

## 2011-12-04 LAB — VITAMIN D 25 HYDROXY (VIT D DEFICIENCY, FRACTURES): Vit D, 25-Hydroxy: 9.7

## 2011-12-16 ENCOUNTER — Ambulatory Visit (INDEPENDENT_AMBULATORY_CARE_PROVIDER_SITE_OTHER): Payer: 59 | Admitting: Family Medicine

## 2011-12-16 ENCOUNTER — Encounter: Payer: Self-pay | Admitting: Family Medicine

## 2011-12-16 VITALS — BP 91/59 | HR 82 | Wt 104.0 lb

## 2011-12-16 DIAGNOSIS — S92909A Unspecified fracture of unspecified foot, initial encounter for closed fracture: Secondary | ICD-10-CM

## 2011-12-16 DIAGNOSIS — M810 Age-related osteoporosis without current pathological fracture: Secondary | ICD-10-CM

## 2011-12-16 DIAGNOSIS — F329 Major depressive disorder, single episode, unspecified: Secondary | ICD-10-CM

## 2011-12-16 MED ORDER — ALPRAZOLAM 2 MG PO TABS: 2.0000 mg | ORAL_TABLET | Freq: Every evening | ORAL | Status: DC | PRN

## 2011-12-16 NOTE — Progress Notes (Signed)
Subjective:    Patient ID: Ann Guerrero, female    DOB: 19-Aug-1956, 55 y.o.   MRN: [id number]  HPI F/U fracture of 4th MT.  Has been wearing her boot faithfully.  She says overall it feels much better. [name] now getting some soreness in her right lateral ankle and in her right hip. She feels this is secondary to wearing the postop shoe. We also did a bone density scan to evaluate for osteoporosis and her T score was -3.0. We did start her on Fosamax and she felt it was making her nauseated and possibly some loose stools. There she had similar symptoms with anti-inflammatories and so she has been working on trying to decrease her anti-inflammatory intake. She would like to start Fosamax little longer and see if she does okay with that.  She did apply for disability through [employer] recently. Unfortunately she was denied. She did not know if they'll be willing to write a letter of support based on her diagnosis that she would benefit from being on disability. However no doubt be happy to this that I do not actually determine disability. I also recommended that she actually requests a disability exam. [name] unable to stand for longer than 10-10 minutes at a time secondary to her back pain and leg pain. She gets frequent numbness and tingling and burning sensation in both legs and feet. This is also exacerbated if she sits for too long. [name] no longer able to work. Recently she was trying to take care of at least one child. She was previously a Advertising account planner. She finally had to speak to the child's mother to let them know that she would no longer be able to take care of the child because she had almost dropped her several times. She gets weakness in her upper arms. She was very tearful in the office today. She also wanted me to review her DEXA scan results with her today.  Depression at the last office visit I had discussed starting an SSRI for depression. Evidently the medication was  never sent to the pharmacy so she never picked up and started it. After discussing it with her and her husband they decided that I would not be a good choice for her. [name] very worried about the potential for increased suicide risk on the medication. She really has thoughts of not want to live there she denies any active suicidal thoughts.  Review of Systems     Objective:   Physical Exam  Constitutional: She appears well-developed and well-nourished.  HENT:  Head: Normocephalic and atraumatic.  Musculoskeletal:       Right foot with normal range of motion. Toes with normal range of motion. She does have some slight hyperpigmentation to the lateral part of the foot over the fourth and fifth metatarsals. No edema. [name] nontender over the fourth or fifth metatarsals. Ankle with no edema.          Assessment & Plan:  There is metatarsal fracture-she seems to be healing well. [name] nontender on exam today. Fu next week she can wear her postop shoe for about a half a day and then start going with just wearing a supportive shoe. After that she can go without the postop shoe. If her pain persists or if it recurs then please call the office and will get x-ray to confirm that the fracture is healing well.  Osteoporosis-I asked her please let me know  she feels she still getting stomach irritation for the Fosamax and we can try different bisphosphonate. Repeat DEXA scan in 2 years.  Depression I still encouraged her strongly think about starting an antidepressant. This could help with her mood and help her cope with her chronic pain. And some sedation that may even actually help her chronic pain. I do understand that she is nervous about increased suicide risk.

## 2011-12-16 NOTE — Patient Instructions (Signed)
Wear boot for half day for one week and the stop and see if doing well.

## 2011-12-18 ENCOUNTER — Encounter: Payer: Self-pay | Admitting: Family Medicine

## 2012-01-02 ENCOUNTER — Telehealth: Payer: Self-pay | Admitting: *Deleted

## 2012-01-02 NOTE — Telephone Encounter (Signed)
Pt states she spoke with you in regards to a letter of support for disability. She was inquiring if it was ready or not. Please advise. MA, LPN

## 2012-01-05 ENCOUNTER — Encounter: Payer: Self-pay | Admitting: Family Medicine

## 2012-01-05 NOTE — Telephone Encounter (Signed)
Letter printed. Have her review if possible.

## 2012-04-20 ENCOUNTER — Other Ambulatory Visit: Payer: Self-pay | Admitting: *Deleted

## 2012-04-20 MED ORDER — ALPRAZOLAM 2 MG PO TABS: 2.0000 mg | ORAL_TABLET | Freq: Every evening | ORAL | Status: DC | PRN

## 2012-05-26 ENCOUNTER — Other Ambulatory Visit: Payer: Self-pay | Admitting: *Deleted

## 2012-05-26 MED ORDER — LEVOTHYROXINE SODIUM 25 MCG PO TABS: 25.0000 ug | ORAL_TABLET | Freq: Every day | ORAL | Status: DC

## 2012-07-27 ENCOUNTER — Ambulatory Visit: Payer: 59 | Admitting: Family Medicine

## 2012-10-12 ENCOUNTER — Ambulatory Visit (INDEPENDENT_AMBULATORY_CARE_PROVIDER_SITE_OTHER): Payer: Medicare Other | Admitting: Family Medicine

## 2012-10-12 ENCOUNTER — Encounter: Payer: Self-pay | Admitting: Family Medicine

## 2012-10-12 VITALS — BP 101/66 | HR 76 | Ht 61.0 in | Wt 112.0 lb

## 2012-10-12 DIAGNOSIS — E039 Hypothyroidism, unspecified: Secondary | ICD-10-CM

## 2012-10-12 DIAGNOSIS — M549 Dorsalgia, unspecified: Secondary | ICD-10-CM | POA: Diagnosis not present

## 2012-10-12 DIAGNOSIS — M2550 Pain in unspecified joint: Secondary | ICD-10-CM

## 2012-10-12 DIAGNOSIS — R51 Headache: Secondary | ICD-10-CM | POA: Diagnosis not present

## 2012-10-12 DIAGNOSIS — E559 Vitamin D deficiency, unspecified: Secondary | ICD-10-CM | POA: Diagnosis not present

## 2012-10-12 DIAGNOSIS — J309 Allergic rhinitis, unspecified: Secondary | ICD-10-CM

## 2012-10-12 DIAGNOSIS — G8929 Other chronic pain: Secondary | ICD-10-CM | POA: Diagnosis not present

## 2012-10-12 DIAGNOSIS — H9209 Otalgia, unspecified ear: Secondary | ICD-10-CM

## 2012-10-12 DIAGNOSIS — R209 Unspecified disturbances of skin sensation: Secondary | ICD-10-CM | POA: Diagnosis not present

## 2012-10-12 DIAGNOSIS — M810 Age-related osteoporosis without current pathological fracture: Secondary | ICD-10-CM | POA: Insufficient documentation

## 2012-10-12 DIAGNOSIS — N200 Calculus of kidney: Secondary | ICD-10-CM

## 2012-10-12 DIAGNOSIS — M25539 Pain in unspecified wrist: Secondary | ICD-10-CM

## 2012-10-12 DIAGNOSIS — R200 Anesthesia of skin: Secondary | ICD-10-CM

## 2012-10-12 NOTE — Patient Instructions (Addendum)
We will call you with your lab results. If you don't here from [place] in about a week then please give [place] a call at [phone]. Start allegra once a day.  Let me know if it is not helping your headache and runny nose

## 2012-10-12 NOTE — Progress Notes (Signed)
Subjective:    Patient ID: Ann Guerrero, female    DOB: [birth year], 56 y.o.   MRN: [id number]  HPI EAr pain on and off.  Has sharp pain intermitttantly.  Also having frequent runny nose. Does have ragweed allergies.  No pain or drainage from the ears but she has had an occasionally runny nose. She says that when it happens it tends to occur when she bends over and it is usually clear. She says it's embarrassing at times. She's also had a severe headache over her forehead. It has been prominent for the last couple weeks. She does have a history of migraines.  Bialteral wrist pain on and off. Denies any swelling of the joints but says they are so painful at time she feels like her wrist are going to crack. Feels her hands are getting weak and tight.  She also points to some pain and discomfort over the MCPs as well. Again no swelling but just pain. Just has a lot of discomfort in her elbows. In fact her orthopedic surgeon has recommended surgery especially on her left elbow. She was scheduled for EMG studies but after sitting for 2 hours waiting she left and never had the test done.  Says sometimes feels her thyroid is more swollen.  [name] taking her meds regularly. Says has been taking iodine as well.      Review of Systems     Objective:   Physical Exam  Constitutional: [name] oriented to person, place, and time. She appears well-developed and well-nourished.  HENT:  Head: Normocephalic and atraumatic.  Right Ear: External ear normal.  Left Ear: External ear normal.  Nose: Nose normal.  Mouth/Throat: Oropharynx is clear and moist.       TMs and canals are clear.   Eyes: Conjunctivae normal and EOM are normal. Pupils are equal, round, and reactive to light.  Neck: Neck supple. Thyromegaly present.       No palpable nodule in the thyroid.  Cardiovascular: Normal rate, regular rhythm and normal heart sounds.   Pulmonary/Chest: Effort normal and breath sounds normal. She has no  wheezes.  Lymphadenopathy:    She has no cervical adenopathy.  Neurological: [name] alert and oriented to person, place, and time.  Skin: Skin is warm and dry.  Psychiatric: She has a normal mood and affect. Her behavior is normal.          Assessment & Plan:  Ear Pain - Ear exam is normal todday. May be from AR especially considering she's also been having a headache as well as intermittent runny nose that is clear..  Recommend start  allegra.  She says her husband has some home.  Headache-she's had a severe headache across the forehead for the last couple weeks. I think this actually could be allergy related. Also consider could be one of her migraines that she feels a little different.  Hypothyroidism-recheck TSH. Make sure that her level is well-controlled. She also has a history of goiter. Most people do not iodine from the diet and did not actually need an iodine supplement. Though I do not think it's harmfulo take it.  Bilateral Wrist pain - will check sedimentation rate and rheumatoid factor. I'm not sure she's ever had a workup for rheumatoid arthritis. She has not had any actually swollen digits but certainly the location and severity of her pain I think it's worth looking at some blood work. Maybe even considering a referral to a rheumatologist for further evaluation.  Osteoporosis-she decided not to take the Fosamax. She did take and tolerated it well initially but after reading the side effects on the side of the bottom she decided to discontinue it and says she plans on taking an over-the-counter herbal supplement instead. Also reassured her that I see that osteoporosis does not cause pain. She also is taking one calcium supplement a day. She read on line that increase calcium to increase her risk of kidney stones and she is Re: had a history of these. Certainly I think it's reasonable to only take the calcium once a day and try to get the rest of her calcium intake from  diet.  Declined flu vaccine.

## 2012-10-13 ENCOUNTER — Telehealth: Payer: Self-pay | Admitting: *Deleted

## 2012-10-13 MED ORDER — ALPRAZOLAM 2 MG PO TABS: 2.0000 mg | ORAL_TABLET | Freq: Every evening | ORAL | Status: DC | PRN

## 2012-10-13 NOTE — Telephone Encounter (Signed)
Printed. Can fax today.

## 2012-10-13 NOTE — Telephone Encounter (Signed)
Pt seen yesterday and thought you were gonna send her refill for xanax 2mg  tabs to [facility]. Pharmacy does not have them and wants to know if you can do this today

## 2012-10-18 ENCOUNTER — Telehealth: Payer: Self-pay | Admitting: Family Medicine

## 2012-10-18 DIAGNOSIS — R7989 Other specified abnormal findings of blood chemistry: Secondary | ICD-10-CM

## 2012-10-18 NOTE — Telephone Encounter (Signed)
Call pt: Blood count is nl. Nl thyroid. i did repeat her rheumatoid panel. So far negative.  Still awaiting one test result.  Ferrin which is iron stores look great so if taking any extra iron supplement can stop.  Vit D is low. Rec 1000 IU daily.  B12 is normal. Is her HA better since treating her allergies?

## 2012-10-18 NOTE — Telephone Encounter (Signed)
Pt informed. Pt states she is not on any iron supplements. She will increase Vit D to 1000 iu. Still having HAs. [name] 5 days w/ out smoking and has no desire to smoke.

## 2012-10-19 DIAGNOSIS — R7989 Other specified abnormal findings of blood chemistry: Secondary | ICD-10-CM | POA: Diagnosis not present

## 2012-10-19 LAB — HEPATIC FUNCTION PANEL
ALT: 16 U/L (ref 7–35)
Bilirubin, Total: 0.3 mg/dL

## 2012-10-19 LAB — FERRITIN, SERUM (SERIAL): Ferritin: 172

## 2012-10-19 NOTE — Telephone Encounter (Signed)
Her ferritin was high. Since not on iron I want her to repeat the test with some additional labs.  Can go anytime.

## 2012-10-19 NOTE — Telephone Encounter (Signed)
Pt aware. Will print labs so Pt can go to [facility]

## 2012-10-20 ENCOUNTER — Telehealth: Payer: Self-pay | Admitting: Family Medicine

## 2012-10-20 DIAGNOSIS — R7989 Other specified abnormal findings of blood chemistry: Secondary | ICD-10-CM

## 2012-10-20 NOTE — Telephone Encounter (Signed)
Call patient: Her ferritin is still elevated. Since she's not taking a supplement I would like for her to see hematology. If she's okay with referral please let me know and I'll put in.

## 2012-10-20 NOTE — Telephone Encounter (Signed)
Referral placed.

## 2012-10-20 NOTE — Telephone Encounter (Signed)
Pt informed. Pt would like to see hematology ASAP but prefers morning apt.

## 2012-10-25 ENCOUNTER — Encounter: Payer: Self-pay | Admitting: *Deleted

## 2012-10-29 ENCOUNTER — Telehealth: Payer: Self-pay | Admitting: Hematology & Oncology

## 2012-10-29 NOTE — Telephone Encounter (Signed)
Pt aware of 11-29 appointment °

## 2012-11-26 ENCOUNTER — Other Ambulatory Visit: Payer: Medicare Other | Admitting: Lab

## 2012-11-26 ENCOUNTER — Ambulatory Visit (HOSPITAL_BASED_OUTPATIENT_CLINIC_OR_DEPARTMENT_OTHER): Payer: Medicare Other | Admitting: Hematology & Oncology

## 2012-11-26 ENCOUNTER — Ambulatory Visit: Payer: Medicare Other

## 2012-11-26 ENCOUNTER — Other Ambulatory Visit (HOSPITAL_BASED_OUTPATIENT_CLINIC_OR_DEPARTMENT_OTHER): Payer: Medicare Other

## 2012-11-26 VITALS — BP 109/61 | HR 60 | Temp 98.0°F | Resp 18 | Ht 61.0 in | Wt 114.0 lb

## 2012-11-26 DIAGNOSIS — R7989 Other specified abnormal findings of blood chemistry: Secondary | ICD-10-CM

## 2012-11-26 DIAGNOSIS — M1990 Unspecified osteoarthritis, unspecified site: Secondary | ICD-10-CM | POA: Diagnosis not present

## 2012-11-26 DIAGNOSIS — R52 Pain, unspecified: Secondary | ICD-10-CM

## 2012-11-26 LAB — CBC WITH DIFFERENTIAL (CANCER CENTER ONLY)
Eosinophils Absolute: 0.1 10*3/uL (ref 0.0–0.5)
MCH: 32.3 pg (ref 26.0–34.0)
MONO%: 8.4 % (ref 0.0–13.0)
NEUT#: 4.3 10*3/uL (ref 1.5–6.5)
Platelets: 282 10*3/uL (ref 145–400)
RBC: 4.31 10*6/uL (ref 3.70–5.32)
WBC: 7.1 10*3/uL (ref 3.9–10.0)

## 2012-11-26 LAB — CHCC SATELLITE - SMEAR

## 2012-11-26 NOTE — Progress Notes (Signed)
This office note has been dictated.
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
## 2012-11-30 DIAGNOSIS — M5137 Other intervertebral disc degeneration, lumbosacral region: Secondary | ICD-10-CM | POA: Diagnosis not present

## 2012-11-30 DIAGNOSIS — M5030 Other cervical disc degeneration, unspecified cervical region: Secondary | ICD-10-CM | POA: Diagnosis not present

## 2012-12-01 ENCOUNTER — Other Ambulatory Visit: Payer: Self-pay | Admitting: *Deleted

## 2012-12-01 ENCOUNTER — Other Ambulatory Visit: Payer: Self-pay | Admitting: Family Medicine

## 2012-12-01 MED ORDER — LEVOTHYROXINE SODIUM 25 MCG PO TABS: 25.0000 ug | ORAL_TABLET | Freq: Every day | ORAL | Status: DC

## 2012-12-02 DIAGNOSIS — M542 Cervicalgia: Secondary | ICD-10-CM | POA: Diagnosis not present

## 2012-12-02 DIAGNOSIS — R209 Unspecified disturbances of skin sensation: Secondary | ICD-10-CM | POA: Diagnosis not present

## 2012-12-02 LAB — HEPATIC FUNCTION PANEL
ALT: 10 U/L (ref 0–35)
AST: 17 U/L (ref 0–37)
Alkaline Phosphatase: 56 U/L (ref 39–117)
Indirect Bilirubin: 0.4 mg/dL (ref 0.0–0.9)
Total Protein: 6.5 g/dL (ref 6.0–8.3)

## 2012-12-02 LAB — FERRITIN: Ferritin: 131 ng/mL (ref 10–291)

## 2012-12-02 LAB — IRON AND TIBC
Iron: 128 ug/dL (ref 42–145)
UIBC: 175 ug/dL (ref 125–400)

## 2012-12-02 LAB — AFP TUMOR MARKER: AFP-Tumor Marker: 3.3 ng/mL (ref 0.0–8.0)

## 2012-12-10 ENCOUNTER — Encounter: Payer: Self-pay | Admitting: Hematology & Oncology

## 2012-12-10 ENCOUNTER — Other Ambulatory Visit: Payer: Self-pay | Admitting: Hematology & Oncology

## 2012-12-10 HISTORY — DX: Hemochromatosis, unspecified: E83.119

## 2012-12-13 ENCOUNTER — Telehealth: Payer: Self-pay | Admitting: Hematology & Oncology

## 2012-12-13 NOTE — Telephone Encounter (Signed)
Per MD orders, to call patient to sch phlebotomy and 6 weeks lab/md apt.  Patient sch apt for 12/16/12 and 01/24/13.

## 2012-12-16 ENCOUNTER — Ambulatory Visit (HOSPITAL_BASED_OUTPATIENT_CLINIC_OR_DEPARTMENT_OTHER): Payer: Medicare Other

## 2012-12-16 MED ORDER — SODIUM CHLORIDE 0.9 % IV SOLN
Freq: Once | INTRAVENOUS | Status: AC
  Administered 2012-12-16: 15:00:00 via INTRAVENOUS

## 2012-12-16 NOTE — Patient Instructions (Signed)

## 2012-12-16 NOTE — Progress Notes (Signed)
Ann Guerrero presents today for phlebotomy per MD orders. Phlebotomy procedure started at 1349 and ended at 1401. Approximately 500 mls removed. Pt complain of dizziness, unable to obtain BP with dinamap, manual cuff 64/50. IV started with NS wide open, O2 started via [name] at 6 LPM. Dr. Myna Hidalgo at bedside. 2:29 PM BP100/64 Pulse 65. IVFs continue. See [facility] flowsheet. 3:51 PM IV needle removed intact. Teola Bradley, Rebakah Cokley [employer]

## 2012-12-17 ENCOUNTER — Encounter: Payer: Self-pay | Admitting: Physician Assistant

## 2012-12-17 ENCOUNTER — Telehealth: Payer: Self-pay | Admitting: Hematology & Oncology

## 2012-12-17 ENCOUNTER — Ambulatory Visit (INDEPENDENT_AMBULATORY_CARE_PROVIDER_SITE_OTHER): Payer: Medicare Other | Admitting: Physician Assistant

## 2012-12-17 VITALS — BP 98/60 | HR 68 | Temp 97.9°F | Resp 18 | Wt 116.0 lb

## 2012-12-17 DIAGNOSIS — I959 Hypotension, unspecified: Secondary | ICD-10-CM | POA: Diagnosis not present

## 2012-12-17 DIAGNOSIS — M542 Cervicalgia: Secondary | ICD-10-CM

## 2012-12-17 DIAGNOSIS — R5383 Other fatigue: Secondary | ICD-10-CM

## 2012-12-17 DIAGNOSIS — R531 Weakness: Secondary | ICD-10-CM

## 2012-12-17 DIAGNOSIS — R5381 Other malaise: Secondary | ICD-10-CM | POA: Diagnosis not present

## 2012-12-17 NOTE — Patient Instructions (Addendum)
Increase salt intake. Continue to monitor BP. Check and pharmacy. Call if readings staying low.

## 2012-12-17 NOTE — Telephone Encounter (Signed)
Patient called stating she was feeling really bad after here phlebotomy apt yesterday.  I called the RN so patient could speak with her to share her issues.  RN was busy and did not answer, so a message was taken and note was give to RN to call patient back.

## 2012-12-17 NOTE — Progress Notes (Signed)
  Subjective:    Patient ID: Ann Guerrero, female    DOB: [birth year], 56 y.o.   MRN: [id number]  HPI Patient presents with weakness after Phlebotomy procedure for hemochromatosis yesterday. Before procedure she admits to not drinking or eating. She had phlebotomy and after they finished BP dropped to 50's over 40's. She was very dizzy but did not pass out. They ended up giving her an IV bolus. She still feels weak and her neck hurts around her thyroid area. [name] on thyroid medication. She denies any fever, chills, muscle pains, sore throat, ear pain, SOB.  Pt does not smoke any longer.  Review of Systems     Objective:   Physical Exam  Constitutional: [name] oriented to person, place, and time. She appears well-developed and well-nourished.  HENT:  Head: Normocephalic and atraumatic.  Eyes: Conjunctivae normal are normal.  Neck: Normal range of motion. Neck supple.       Goiter palpated on left side of neck.   Cardiovascular: Normal rate, regular rhythm and normal heart sounds.   Pulmonary/Chest: Effort normal and breath sounds normal. She has no wheezes.  Lymphadenopathy:    She has no cervical adenopathy.  Neurological: [name] alert and oriented to person, place, and time.  Skin: Skin is warm and dry.       Face appears pale.  Psychiatric: She has a normal mood and affect. Her behavior is normal.          Assessment & Plan:  Weakness/Fatigue/Low blood pressure/neck pain- I reassured patient that I suspected weakness could be due to low blood pressure. Work on getting it up with fluid intake and increasing salt. Continue to monitor would problem feel better if top around 115. We could do CBC and other vitamin and electrolyte work up along with TSH but would have to draw blood. Patient refuses to have blood drawn today. Did give lab slip for TSH that if her neck continues to hurt she could get drawn on Monday. Stay hydrated and if BP doesn't come up and still fatigued follow  up.

## 2013-01-11 ENCOUNTER — Telehealth: Payer: Self-pay | Admitting: *Deleted

## 2013-01-11 NOTE — Telephone Encounter (Signed)
Call pt If severe cough then needs appt.  Cheratussin has a narcotic in it so we don't rx if unless has been seen. Can use delsym otc.

## 2013-01-11 NOTE — Telephone Encounter (Signed)
Pt states she has bad cough.  Wants cherry tussin sent to [facility].  Says you used to could buy this OTC.

## 2013-01-12 NOTE — Telephone Encounter (Signed)
Pt informed

## 2013-01-24 ENCOUNTER — Other Ambulatory Visit (HOSPITAL_BASED_OUTPATIENT_CLINIC_OR_DEPARTMENT_OTHER): Payer: Medicare Other | Admitting: Lab

## 2013-01-24 ENCOUNTER — Ambulatory Visit (HOSPITAL_BASED_OUTPATIENT_CLINIC_OR_DEPARTMENT_OTHER): Payer: Medicare Other | Admitting: Hematology & Oncology

## 2013-01-24 LAB — CBC WITH DIFFERENTIAL (CANCER CENTER ONLY)
BASO#: 0.1 10*3/uL (ref 0.0–0.2)
Eosinophils Absolute: 0.1 10*3/uL (ref 0.0–0.5)
HGB: 13.6 g/dL (ref 11.6–15.9)
LYMPH#: 2 10*3/uL (ref 0.9–3.3)
MONO#: 0.7 10*3/uL (ref 0.1–0.9)
NEUT#: 3.7 10*3/uL (ref 1.5–6.5)
RBC: 4.24 10*6/uL (ref 3.70–5.32)
WBC: 6.6 10*3/uL (ref 3.9–10.0)

## 2013-01-24 LAB — IRON AND TIBC
TIBC: 311 ug/dL (ref 250–470)
UIBC: 237 ug/dL (ref 125–400)

## 2013-01-24 LAB — FERRITIN: Ferritin: 91 ng/mL (ref 10–291)

## 2013-01-24 NOTE — Progress Notes (Signed)
This office note has been dictated.

## 2013-01-25 DIAGNOSIS — G5620 Lesion of ulnar nerve, unspecified upper limb: Secondary | ICD-10-CM | POA: Diagnosis not present

## 2013-01-25 DIAGNOSIS — G5600 Carpal tunnel syndrome, unspecified upper limb: Secondary | ICD-10-CM | POA: Diagnosis not present

## 2013-01-25 NOTE — Progress Notes (Signed)
CC:   Nani Gasser, M.D.  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  CURRENT THERAPY:  Phlebotomy to maintain ferritin less than 100.  INTERIM HISTORY:  Ms. Winchel comes in for her second office visit.  When we first saw her, we, surprising enough, found that she did have hemochromatosis.  [name] heterozygous for the major mutation.  We did do a phlebotomy on her.  She had a miserable time with this.  Sounds like she had a vasovagal reaction afterwards.  She said that felt poorly for 3 days.  She said her thyroid swelled up.  She said she had a hard time swallowing.  She also said that her vision has been blurred since her phlebotomy.  I cannot make any physiological sense out of the blurred vision.  That really should not happen with phlebotomies.  She still feels a little bit tired.  Her ferritin when we saw her was 131.  This should be down below 100.  I think that if we do do phlebotomies on her in the future, she will need IV fluids.  [name] now complaining of itching.  She says that this might be from the hemochromatosis.  I told that it is not from hemochromatosis.  People with hemochromatosis who itch have itching because of biliary salt deposition under the skin.  She does not have liver failure.  Her liver tests are perfect.  She has had no fever.  There has been no bleeding.  There has been no change in bowel or bladder habits.  PHYSICAL EXAMINATION:  General:  This is a well-developed, well- nourished white female in no obvious distress.  Vital signs: Temperature of 07.8, pulse 61, respiratory rate 16, blood pressure 113/63.  Weight is 117.  Head and neck:  Normocephalic, atraumatic skull.  There are no ocular or oral lesions.  There are no palpable cervical or supraclavicular lymph nodes.  Lungs:  Clear bilaterally. Cardiac:  Regular rate and rhythm with a normal S1 and S2.  There are no murmurs, rubs, or bruits.  Abdomen:  Soft with  good bowel sounds.  There is no palpable abdominal mass.  There is no palpable hepatosplenomegaly. Extremities:  No clubbing, cyanosis, or edema.  Neurological:  No focal neurological deficits.  LABORATORY STUDIES:  White cell count is 6.6, hemoglobin is 13.6, hematocrit 41.1, platelet count 269.  MCV is 97.  IMPRESSION:  Ms. Duggar Is a very nice 57 year old white female with hemochromatosis.  I talked to her at length about this.  I told that she had a "mild" form of hemochromatosis, that she only inherited 1 gene.  We will see what her ferritin is.  Again, I think we have flexibility with phlebotomizing her as she may have difficulties.  I think that if we do phlebotomize her  again, she will need to have the blood taken out very slowly.  We will plan to get her back in another couple of months or so for followup.    ______________________________ Josph Macho, M.D. PRE/MEDQ  D:  01/24/2013  T:  01/25/2013  Job:  [id number]

## 2013-02-04 ENCOUNTER — Telehealth: Payer: Self-pay | Admitting: Oncology

## 2013-02-04 NOTE — Telephone Encounter (Addendum)
Message copied by Lacie Draft on Fri Feb 04, 2013  4:58 PM ------      Message from: Arlan Organ R      Created: Thu Feb 03, 2013  5:50 PM       Cal - iron has come down niicely!!  Loyal Jacobson with patient regarding iron.

## 2013-02-07 ENCOUNTER — Encounter: Payer: Self-pay | Admitting: Family Medicine

## 2013-02-07 ENCOUNTER — Ambulatory Visit (INDEPENDENT_AMBULATORY_CARE_PROVIDER_SITE_OTHER): Payer: Medicare Other | Admitting: Family Medicine

## 2013-02-07 VITALS — BP 113/65 | HR 69 | Wt 116.0 lb

## 2013-02-07 DIAGNOSIS — E042 Nontoxic multinodular goiter: Secondary | ICD-10-CM

## 2013-02-07 DIAGNOSIS — E039 Hypothyroidism, unspecified: Secondary | ICD-10-CM

## 2013-02-07 DIAGNOSIS — M542 Cervicalgia: Secondary | ICD-10-CM

## 2013-02-07 DIAGNOSIS — M79609 Pain in unspecified limb: Secondary | ICD-10-CM

## 2013-02-07 MED ORDER — LEVOTHYROXINE SODIUM 25 MCG PO TABS: 25.0000 ug | ORAL_TABLET | Freq: Every day | ORAL | Status: DC

## 2013-02-07 NOTE — Progress Notes (Signed)
  Subjective:    Patient ID: Ann Guerrero, female    DOB: [birth year], 57 y.o.   MRN: [id number]  HPI Recently has been breaking out in hives. Says was happening years ago but has started again. Says has been under a lot of stress lately.  [name] now taking care of her grnadaughter. [name] having itching with it as well.  She doesn't have any hives or rash today for me to look at.  She did have blood drawn for her hemochromatosis.  She said towards the end of the blood draw she started to feel like there were hands closing around her throat. She says she didn't feel well at all. Since then she has had a feeling of discomfort in the throat. Especially on the left side.  [name] having some pain on her neck. Says pills are getting stuck in her throat when swallows. Says feels like a scab inside the left side of her throat.  Says feels like her thyroid swelled after had pint of blood removed.    Still seeing dr Ann Guerrero with her neck. He is considering surgery again. The she feels like she's not interested at this time.   Review of Systems     Objective:   Physical Exam  Constitutional: [name] oriented to person, place, and time. She appears well-developed and well-nourished.  HENT:  Head: Normocephalic and atraumatic.  Neck: Neck supple. No thyromegaly present.  Cardiovascular: Normal rate, regular rhythm and normal heart sounds.   Pulmonary/Chest: Effort normal and breath sounds normal.  Lymphadenopathy:    She has no cervical adenopathy.  Neurological: [name] alert and oriented to person, place, and time.  Skin: Skin is warm and dry.  Psychiatric: She has a normal mood and affect. Her behavior is normal.          Assessment & Plan:  [place] Goiter - Will schedule for [place].  If normal and still having dysphagia then consider GI referral for further evluation. We will call hematology and see if they have checked her thyroid recently. If not then we will draw TSH.  Dysphasia-certainly could  be an esophageal stricture. She feels like it could be more like her thyroid so we will investigate that first. If it continues to persist they consider referral to GI. Next  Hemochromatosis-currently being followed by hematology. She's doing well and reports that her recent blood work looked great.   hypogonadism-refill medication.  Arm pain and low back pain-currently following with her orthopedist. Unfortunately she has had some significant nerve damage.

## 2013-02-07 NOTE — Patient Instructions (Signed)
Will schedule ultrasound. Prefers after 9 am.

## 2013-02-07 NOTE — Assessment & Plan Note (Signed)
Since having neck pain and having some thyroid swelling.  Will schedule for an [place]. Will see if can can find last TSH level.   Lab Results  Component Value Date   TSH 1.16 11/28/2011

## 2013-02-08 ENCOUNTER — Ambulatory Visit (HOSPITAL_BASED_OUTPATIENT_CLINIC_OR_DEPARTMENT_OTHER)
Admission: RE | Admit: 2013-02-08 | Discharge: 2013-02-08 | Disposition: A | Discharge status: Home / Self Care | Payer: Medicare Other | Source: Ambulatory Visit | Attending: Family Medicine | Admitting: Family Medicine

## 2013-02-08 ENCOUNTER — Other Ambulatory Visit: Payer: Self-pay | Admitting: *Deleted

## 2013-02-08 ENCOUNTER — Telehealth: Payer: Self-pay | Admitting: *Deleted

## 2013-02-08 DIAGNOSIS — E039 Hypothyroidism, unspecified: Secondary | ICD-10-CM

## 2013-02-08 DIAGNOSIS — R1310 Dysphagia, unspecified: Secondary | ICD-10-CM | POA: Insufficient documentation

## 2013-02-08 DIAGNOSIS — E042 Nontoxic multinodular goiter: Secondary | ICD-10-CM

## 2013-02-08 DIAGNOSIS — M542 Cervicalgia: Secondary | ICD-10-CM

## 2013-02-08 NOTE — Telephone Encounter (Signed)
Called pt and informed her that Dr. Linford Arnold would like for her to have a TSH drawn. Pt informed and is on her way to have this done.Loralee Pacas [place]

## 2013-02-09 LAB — TSH: TSH: 1.184 u[IU]/mL (ref 0.350–4.500)

## 2013-03-07 ENCOUNTER — Ambulatory Visit (INDEPENDENT_AMBULATORY_CARE_PROVIDER_SITE_OTHER): Payer: Medicare Other | Admitting: Family Medicine

## 2013-03-07 ENCOUNTER — Encounter: Payer: Self-pay | Admitting: Family Medicine

## 2013-03-07 VITALS — BP 98/64 | HR 61 | Ht 61.0 in | Wt 116.0 lb

## 2013-03-07 DIAGNOSIS — R21 Rash and other nonspecific skin eruption: Secondary | ICD-10-CM | POA: Diagnosis not present

## 2013-03-07 DIAGNOSIS — R1310 Dysphagia, unspecified: Secondary | ICD-10-CM

## 2013-03-07 NOTE — Progress Notes (Signed)
  Subjective:    Patient ID: Ann Guerrero, female    DOB: 1956-09-23, 57 y.o.   MRN: [id number]  HPI Says still feels like pills are occ stuck.  Says still feels like a scab on the left side of her throat. Woke up with clear nasal drainage yesterday and today throat feels scratchey  Still having localized pruritis. Says it will just be in one place like her back and will be very itchy. She will try to moiturize it.  Says can take hours before gets relief.  Says has tried benadryl and sarna without any significant relief.  Says coconut oil helps.  Says it stings and hurts.  Says happens every 2-3 days.       Review of Systems     Objective:   Physical Exam  Constitutional: [name] oriented to person, place, and time. She appears well-developed and well-nourished.  HENT:  Head: Normocephalic and atraumatic.  Cardiovascular: Normal rate, regular rhythm and normal heart sounds.   Pulmonary/Chest: Effort normal and breath sounds normal.  Neurological: [name] alert and oriented to person, place, and time.  Skin: Skin is warm and dry. No rash noted.  Psychiatric: She has a normal mood and affect. Her behavior is normal.          Assessment & Plan:  Dysphagia - at this point I think she needs a referral to GI to make sure that she doesn't have any lesions in her throat. [name] a former smoker was certainly puts her at higher risk of throat cancer. If her exam and workup is normal then consider that this could be coming from her multinodular goiter. Sometimes it does cause a pressure sensation and we could consider adjusting her low-dose thyroid medication for suppression of growth of the gland itself.  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  Hives based on her description of her rash and the failure that she showed me on her phone I really think that this is some form of hives. The Sarna it does seem to help but she says  the smell on this makes her nauseated so she really doesn't use it very often. She's been trying to moisturize it consistently. In the areas seem to move around. So not sure what this is related to. I did encourage her to try not to scratch the areas. She certainly could consider a more long-acting antihistamine such as Claritin on a daily basis.

## 2013-03-09 DIAGNOSIS — R21 Rash and other nonspecific skin eruption: Secondary | ICD-10-CM | POA: Diagnosis not present

## 2013-03-14 DIAGNOSIS — K219 Gastro-esophageal reflux disease without esophagitis: Secondary | ICD-10-CM | POA: Diagnosis not present

## 2013-03-14 DIAGNOSIS — R490 Dysphonia: Secondary | ICD-10-CM | POA: Diagnosis not present

## 2013-03-14 DIAGNOSIS — R1310 Dysphagia, unspecified: Secondary | ICD-10-CM | POA: Diagnosis not present

## 2013-03-14 DIAGNOSIS — E042 Nontoxic multinodular goiter: Secondary | ICD-10-CM | POA: Diagnosis not present

## 2013-04-25 ENCOUNTER — Other Ambulatory Visit (HOSPITAL_BASED_OUTPATIENT_CLINIC_OR_DEPARTMENT_OTHER): Payer: Medicare Other | Admitting: Lab

## 2013-04-25 ENCOUNTER — Ambulatory Visit (HOSPITAL_BASED_OUTPATIENT_CLINIC_OR_DEPARTMENT_OTHER): Payer: Medicare Other | Admitting: Hematology & Oncology

## 2013-04-25 ENCOUNTER — Ambulatory Visit: Payer: Medicare Other | Admitting: Hematology & Oncology

## 2013-04-25 ENCOUNTER — Other Ambulatory Visit: Payer: Medicare Other | Admitting: Lab

## 2013-04-25 LAB — CBC WITH DIFFERENTIAL (CANCER CENTER ONLY)
BASO%: 0.7 % (ref 0.0–2.0)
EOS%: 1.3 % (ref 0.0–7.0)
HCT: 43.8 % (ref 34.8–46.6)
LYMPH%: 33 % (ref 14.0–48.0)
MCH: 31.2 pg (ref 26.0–34.0)
MCHC: 33.3 g/dL (ref 32.0–36.0)
MCV: 94 fL (ref 81–101)
MONO%: 8.8 % (ref 0.0–13.0)
NEUT%: 56.2 % (ref 39.6–80.0)
RDW: 12.9 % (ref 11.1–15.7)

## 2013-04-25 NOTE — Progress Notes (Signed)
This office note has been dictated.

## 2013-04-26 ENCOUNTER — Telehealth: Payer: Self-pay | Admitting: *Deleted

## 2013-04-26 NOTE — Progress Notes (Signed)
CC:   Nani Gasser, M.D.  DIAGNOSIS:  Hemochromatosis (C282Y mutation-heterozygote)  CURRENT THERAPY:  Phlebotomy to maintain ferritin less than 100.  INTERIM HISTORY:  Ms. Keefe comes in for followup.  [name] doing okay. There is a lot of stress at home.  There are issues with her family that she is trying to handle.  She does have some arthritis.  She has some back issues.  She has had no nausea or vomiting.  There has been no leg swelling.  She has had a little bit of a cough.  There is no fever.  When we last saw her in January, her ferritin was 91 with an iron saturation of 24%.  PHYSICAL EXAM:  General:  This is a well-developed, well-nourished white female in no obvious distress.  Vital signs:  Temperature of 98.1, pulse 66, respiratory rate 16, blood pressure 112/76.  Weight is 117.  Head and neck:  Normocephalic, atraumatic skull.  There are no ocular or oral lesions.  There are no palpable cervical or supraclavicular lymph nodes. Lungs:  Clear bilaterally.  Cardiac:  Regular rate and rhythm with a normal S1 and S2.  There are no murmurs, rubs, or bruits.  Abdomen: Soft with good bowel sounds.  There is no palpable abdominal mass. There is no fluid wave.  There is no palpable hepatosplenomegaly. Extremities:  No clubbing, cyanosis, or edema.  Back:  No kyphosis. Skin:  No rashes, ecchymoses, or petechiae.  LABORATORY STUDIES:  White cell count is 7.1, hemoglobin 14.6, hematocrit 43.8, platelet count 288.  Ferritin is 77 with an iron saturation of 25%.  IMPRESSION:  Ms. Bandel is a 57 year old white female with hemochromatosis.  I forgot to mention that she is complaining of some pruritus.  I told her to try some Pepcid.  This may help.  I am glad that her ferritin is still coming down.  She does not need to be phlebotomized.  We will go ahead and plan to get her back to see me in another couple months or so.    ______________________________ Josph Macho, M.D. PRE/MEDQ  D:  04/25/2013  T:  04/26/2013  Job:  [id number]

## 2013-04-26 NOTE — Telephone Encounter (Addendum)
Message copied by Mirian Capuchin on Tue Apr 26, 2013  4:53 PM ------      Message from: Arlan Organ R      Created: Tue Apr 26, 2013  1:33 PM       Call - iron is now down to 77!!  No need for phlebotomy!! Pete ------Pt called and given the above message.  Voiced understanding.

## 2013-05-17 ENCOUNTER — Encounter: Payer: Self-pay | Admitting: Sports Medicine

## 2013-05-17 ENCOUNTER — Ambulatory Visit (INDEPENDENT_AMBULATORY_CARE_PROVIDER_SITE_OTHER): Payer: Medicare Other

## 2013-05-17 ENCOUNTER — Ambulatory Visit: Payer: Medicare Other | Admitting: Family Medicine

## 2013-05-17 ENCOUNTER — Ambulatory Visit (INDEPENDENT_AMBULATORY_CARE_PROVIDER_SITE_OTHER): Payer: Medicare Other | Admitting: Sports Medicine

## 2013-05-17 VITALS — BP 113/76 | HR 67 | Wt 117.0 lb

## 2013-05-17 DIAGNOSIS — R079 Chest pain, unspecified: Secondary | ICD-10-CM | POA: Diagnosis not present

## 2013-05-17 DIAGNOSIS — S298XXA Other specified injuries of thorax, initial encounter: Secondary | ICD-10-CM | POA: Diagnosis not present

## 2013-05-17 DIAGNOSIS — S2239XA Fracture of one rib, unspecified side, initial encounter for closed fracture: Secondary | ICD-10-CM

## 2013-05-17 DIAGNOSIS — R0781 Pleurodynia: Secondary | ICD-10-CM

## 2013-05-17 DIAGNOSIS — R0789 Other chest pain: Secondary | ICD-10-CM

## 2013-05-17 MED ORDER — MELOXICAM 15 MG PO TABS: ORAL_TABLET | ORAL | Status: DC

## 2013-05-17 NOTE — Assessment & Plan Note (Addendum)
I strapped the thorax. Mobic. X-rays. Return in 2 weeks. Deep breathing exercises. I am certainly happy to go up on her pain medicine should this prove insufficient.  I billed a fracture code for this visit, all subsequent visits for this complaint will be "post-op checks" in the global period.

## 2013-05-17 NOTE — Progress Notes (Addendum)
  Subjective:    CC: Right-sided rib pain  HPI: This pleasant 57 year old female with a history of osteoporosis and dropped her Bible, and over to pick up and felt a pop in her right anterior rib. Currently she has a lot of pain, it is difficult to take deep breaths. No coughing, no hemoptysis. Pain is localized, does not radiate, moderate to severe. Stable  Past medical history, Surgical history, Family history not pertinant except as noted below, Social history, Allergies, and medications have been entered into the medical record, reviewed, and no changes needed.   Review of Systems: No fevers, chills, night sweats, weight loss, chest pain, or shortness of breath.   Objective:    General: Well Developed, well nourished, and in no acute distress.  Neuro: Alert and oriented x3, extra-ocular muscles intact, sensation grossly intact.  HEENT: Normocephalic, atraumatic, pupils equal round reactive to light, neck supple, no masses, no lymphadenopathy, thyroid nonpalpable.  Skin: Warm and dry, no rashes. Cardiac: Regular rate and rhythm, no murmurs rubs or gallops, no lower extremity edema.  Respiratory: Clear to auscultation bilaterally. Not using accessory muscles, speaking in full sentences. There is a discrete area of tenderness to palpation over the anterior ninth rib. There is no crepitus.  I strapped her thorax with compressive dressing.  X-rays reviewed, there is a nondisplaced, minimally angulated fractures of the cortex of the anterior ninth rib.  Impression and Recommendations:

## 2013-05-31 ENCOUNTER — Other Ambulatory Visit: Payer: Self-pay | Admitting: *Deleted

## 2013-05-31 ENCOUNTER — Encounter: Payer: Self-pay | Admitting: Sports Medicine

## 2013-05-31 ENCOUNTER — Ambulatory Visit (INDEPENDENT_AMBULATORY_CARE_PROVIDER_SITE_OTHER): Payer: Medicare Other | Admitting: Sports Medicine

## 2013-05-31 VITALS — BP 113/74 | HR 74

## 2013-05-31 DIAGNOSIS — F411 Generalized anxiety disorder: Secondary | ICD-10-CM | POA: Diagnosis not present

## 2013-05-31 DIAGNOSIS — S2232XD Fracture of one rib, left side, subsequent encounter for fracture with routine healing: Secondary | ICD-10-CM

## 2013-05-31 MED ORDER — ALPRAZOLAM 2 MG PO TABS: 2.0000 mg | ORAL_TABLET | Freq: Every evening | ORAL | Status: DC | PRN

## 2013-05-31 NOTE — Progress Notes (Signed)
  Subjective:    CC: Followup  HPI: Rib fracture: Right ninth rib, improved significantly with NSAIDs, strapping of the thorax, and rest.  Stress: She does note that her daughter recently walked out on her 3 children and husband, she's been very stressed out and is tearful. She takes Xanax to sleep, and is desiring a refill. She declines any offers for psychotherapy, or further pharmacologic management with the exception of Xanax. She notes insomnia, poor mood, irritability. No suicidal or homicidal ideation.  Past medical history, Surgical history, Family history not pertinant except as noted below, Social history, Allergies, and medications have been entered into the medical record, reviewed, and no changes needed.   Review of Systems: No fevers, chills, night sweats, weight loss, chest pain, or shortness of breath.   Objective:    General: Well Developed, well nourished, and in no acute distress. Tearful. Neuro: Alert and oriented x3, extra-ocular muscles intact, sensation grossly intact.  HEENT: Normocephalic, atraumatic, pupils equal round reactive to light, neck supple, no masses, no lymphadenopathy, thyroid nonpalpable.  Skin: Warm and dry, no rashes. Cardiac: Regular rate and rhythm, no murmurs rubs or gallops, no lower extremity edema.  Respiratory: Clear to auscultation bilaterally. Not using accessory muscles, speaking in full sentences. Impression and Recommendations:

## 2013-05-31 NOTE — Assessment & Plan Note (Signed)
Offered some brief therapy and reassurance. She declines. She does desire to have her Xanax refilled, I've advised she do this through her primary care provider. Return as needed.

## 2013-05-31 NOTE — Assessment & Plan Note (Signed)
Continues to improve 2 weeks after fracture.

## 2013-06-01 DIAGNOSIS — R21 Rash and other nonspecific skin eruption: Secondary | ICD-10-CM | POA: Diagnosis not present

## 2013-06-02 ENCOUNTER — Other Ambulatory Visit: Payer: Self-pay | Admitting: Family Medicine

## 2013-07-25 ENCOUNTER — Telehealth: Payer: Self-pay | Admitting: *Deleted

## 2013-07-25 ENCOUNTER — Other Ambulatory Visit (HOSPITAL_BASED_OUTPATIENT_CLINIC_OR_DEPARTMENT_OTHER): Payer: Medicare Other | Admitting: Lab

## 2013-07-25 ENCOUNTER — Ambulatory Visit (HOSPITAL_BASED_OUTPATIENT_CLINIC_OR_DEPARTMENT_OTHER): Payer: Medicare Other | Admitting: Hematology & Oncology

## 2013-07-25 LAB — CBC WITH DIFFERENTIAL (CANCER CENTER ONLY)
BASO#: 0.1 10*3/uL (ref 0.0–0.2)
Eosinophils Absolute: 0.1 10*3/uL (ref 0.0–0.5)
HGB: 14.2 g/dL (ref 11.6–15.9)
LYMPH%: 26.2 % (ref 14.0–48.0)
MCH: 31.9 pg (ref 26.0–34.0)
MCV: 98 fL (ref 81–101)
MONO%: 11.7 % (ref 0.0–13.0)
RBC: 4.45 10*6/uL (ref 3.70–5.32)

## 2013-07-25 LAB — IRON AND TIBC CHCC
%SAT: 23 % (ref 21–57)
Iron: 66 ug/dL (ref 41–142)
UIBC: 227 ug/dL (ref 120–384)

## 2013-07-25 NOTE — Telephone Encounter (Addendum)
Message copied by Mirian Capuchin on Mon Jul 25, 2013  3:27 PM ------      Message from: Arlan Organ R      Created: Mon Jul 25, 2013  2:59 PM       Call - iron is only 68!!!  No phlebotomy!!  Cindee Lame ------This message given to pt.  Voiced understanding.

## 2013-07-25 NOTE — Progress Notes (Signed)
This office note has been dictated.

## 2013-07-26 NOTE — Progress Notes (Signed)
CC:   Ann Guerrero, M.D.  DIAGNOSIS:  Hemochromatosis (C2A2Y mutation, heterozygote).  CURRENT THERAPY:  Phlebotomy to maintain ferritin less than 100.  INTERIM HISTORY:  Ann Guerrero comes in for her followup.  We last saw her back in April.  Since then, she has been doing okay.  She apparently broke some ribs on her right side.  This was when she was like leaning over a chair.  She had a fracture of the anterior right 9th rib.  Again, when we saw her back in April, her ferritin was 77 with an iron saturation of 25%.  She was having some abdominal pain.  She has had some cramps.  He is having some diarrhea.  She may have irritable bowel.  There is a lot of stress for her at home.  Apparently there are some issues with grandkids.  She has had no leg swelling.  There have been no rashes.  PHYSICAL EXAMINATION:  General:  This is a petite white female in no obvious distress.  Vital signs:  Temperature of 97.8, pulse 65, respiratory rate 18, blood pressure 124/73.  Weight is 118.  Head and neck:  Normocephalic, atraumatic skull.  There are no ocular or oral lesions.  There are no palpable cervical or supraclavicular lymph nodes. Lungs:  Clear bilaterally.  Cardiac:  Regular rate and rhythm with a normal S1 and S2.  There are no murmurs, rubs or bruits.  Abdomen: Soft.  She has good bowel sounds.  There is some slight tenderness in the epigastric region.  There is no fluid wave.  There is no palpable hepatosplenomegaly.  Extremities:  Show no clubbing, cyanosis or edema. Skin:  No rashes, ecchymosis or petechia.  LABORATORY STUDIES:  White cell count is 7.2, hemoglobin 14.2, hematocrit 43.4, platelet count 294.  IMPRESSION:  Ann Guerrero is a 57 year old white female with hemochromatosis.  So far, I have not seen any complications from the hemochromatosis.  We will see what her ferritin is.  We will plan to get her back in another 3 months.  I do not see a need for any  blood work in between visits.    ______________________________ Josph Macho, M.D. PRE/MEDQ  D:  07/25/2013  T:  07/26/2013  Job:  [id number]

## 2013-08-30 ENCOUNTER — Other Ambulatory Visit: Payer: Self-pay | Admitting: Family Medicine

## 2013-09-02 ENCOUNTER — Ambulatory Visit (INDEPENDENT_AMBULATORY_CARE_PROVIDER_SITE_OTHER): Payer: Medicare Other | Admitting: Family Medicine

## 2013-09-02 ENCOUNTER — Encounter: Payer: Self-pay | Admitting: Family Medicine

## 2013-09-02 VITALS — BP 109/70 | HR 66 | Wt 118.0 lb

## 2013-09-02 DIAGNOSIS — E039 Hypothyroidism, unspecified: Secondary | ICD-10-CM | POA: Diagnosis not present

## 2013-09-02 DIAGNOSIS — S0300XA Dislocation of jaw, unspecified side, initial encounter: Secondary | ICD-10-CM

## 2013-09-02 DIAGNOSIS — R252 Cramp and spasm: Secondary | ICD-10-CM

## 2013-09-02 DIAGNOSIS — Z23 Encounter for immunization: Secondary | ICD-10-CM

## 2013-09-02 DIAGNOSIS — M810 Age-related osteoporosis without current pathological fracture: Secondary | ICD-10-CM

## 2013-09-02 LAB — TSH: TSH: 0.512 u[IU]/mL (ref 0.350–4.500)

## 2013-09-02 LAB — CK: Total CK: 46 U/L (ref 7–177)

## 2013-09-02 LAB — BASIC METABOLIC PANEL WITH GFR
BUN: 13 mg/dL (ref 6–23)
Calcium: 9.3 mg/dL (ref 8.4–10.5)
GFR, Est African American: >89 mL/min
GFR, Est Non African American: >89 mL/min
Potassium: 4.6 mEq/L (ref 3.5–5.3)
Sodium: 137 mEq/L (ref 135–145)

## 2013-09-02 NOTE — Patient Instructions (Signed)
Temporomandibular Joint Pain  Your exam shows that you have a problem with your temporomandibular joint (TMJ), the joint that moves when you open your mouth or chew food. TMJ problems can result from direct injuries, bite abnormalities, or tension states which cause you to grind or clench your teeth. Typical symptoms include pain around the joint, clicking, restricted movement, and headaches.  The TMJ is like any other joint in the body; when it is strained, it needs rest to repair itself. To keep the joint at rest it is important that you do not open your mouth wider than the width of your index finger. If you must yawn, be sure to support your chin with your hand so your mouth does not open wide. Eat a soft diet (nothing firmer than ground beef, no raw vegetables), do not chew gum and do not talk if it causes you pain.  Apply topical heat by using a warm, moist cloth placed in front of the ear for 15 to 20 minutes several times daily. Alternating heat and ice may give even more relief. Anti-inflammatory pain medicine and muscle relaxants can also be helpful. A dental orthotic or splint may be used for temporary relief. Long-term problems may require treatment for stress as well as braces or surgery. Please check with your doctor or dentist if your symptoms do not improve within one week.  Document Released: 01/22/2005 Document Revised: 03/08/2012 Document Reviewed: 12/15/2005  [employer] Patient Information 2014 [employer].
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
## 2013-10-24 ENCOUNTER — Other Ambulatory Visit (HOSPITAL_BASED_OUTPATIENT_CLINIC_OR_DEPARTMENT_OTHER): Payer: Medicare Other | Admitting: Lab

## 2013-10-24 ENCOUNTER — Telehealth: Payer: Self-pay | Admitting: Hematology & Oncology

## 2013-10-24 ENCOUNTER — Ambulatory Visit (HOSPITAL_BASED_OUTPATIENT_CLINIC_OR_DEPARTMENT_OTHER): Payer: Medicare Other | Admitting: Hematology & Oncology

## 2013-10-24 LAB — CBC WITH DIFFERENTIAL (CANCER CENTER ONLY)
BASO#: 0.1 10*3/uL (ref 0.0–0.2)
BASO%: 1 % (ref 0.0–2.0)
EOS%: 2.1 % (ref 0.0–7.0)
HCT: 42.8 % (ref 34.8–46.6)
LYMPH#: 1.6 10*3/uL (ref 0.9–3.3)
LYMPH%: 25.1 % (ref 14.0–48.0)
MCH: 31.5 pg (ref 26.0–34.0)
MCHC: 32.9 g/dL (ref 32.0–36.0)
MONO%: 11.3 % (ref 0.0–13.0)
NEUT%: 60.5 % (ref 39.6–80.0)
RDW: 12.5 % (ref 11.1–15.7)

## 2013-10-24 NOTE — Progress Notes (Signed)
This office note has been dictated.

## 2013-10-24 NOTE — Telephone Encounter (Signed)
Mailed February schedule to pt

## 2013-10-25 NOTE — Progress Notes (Signed)
CC:   Nani Gasser, M.D.  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  CURRENT THERAPY:  Phlebotomy to maintain ferritin less than 100.  INTERIM HISTORY:  Ann Guerrero comes in for followup.  We last saw her 3 months ago.  When we last saw her, her ferritin was only 64.  The patient is still doing pretty well.  She has had no problems with abdominal pain.  She has a lot of back pain.  Hopefully, she will be able to get through the holidays.  She has had no problems with rashes. There has been no cough or shortness breath.  It has been a year since she stopped smoking.  PHYSICAL EXAMINATION:  General:  This is a well-developed, well- nourished white female in no obvious distress.  Vital Signs: Temperature of 98, pulse 60, respiratory rate 14, blood pressure 109/66. Weight is 120 pounds.  Head and Neck:  Shows a normocephalic, atraumatic skull.  There are no ocular or oral lesions.  There are no palpable cervical or supraclavicular lymph nodes.  Lungs:  Clear bilaterally. Cardiac:  Regular rate and rhythm with a normal S1, S2.  There are no murmurs, rubs or bruits.  Abdomen:  Soft.  She has good bowel sounds. There is no fluid wave.  There is no palpable abdominal mass.  There is no palpable hepatosplenomegaly.  Extremities:  Show no clubbing, cyanosis or edema.  Neurologic:  Shows no focal neurological deficits.  LABORATORY STUDIES:  White cell count is 6.3, hemoglobin 14.1, hematocrit 42.8, platelet count is 284.  MCV is 96.  IMPRESSION:  Ms. Pelto is a very charming 57 year old white female with hemochromatosis.  So far, she has had no complications from this. I would think that we can probably get her back in 4 months' time now.  I do not see that we need any lab work in between visits.    ______________________________ Josph Macho, M.D. PRE/MEDQ  D:  10/24/2013  T:  10/25/2013  Job:  [id number]

## 2013-12-03 ENCOUNTER — Other Ambulatory Visit: Payer: Self-pay | Admitting: Family Medicine

## 2014-01-24 ENCOUNTER — Telehealth: Payer: Self-pay | Admitting: *Deleted

## 2014-01-24 MED ORDER — ALPRAZOLAM 2 MG PO TABS: 2.0000 mg | ORAL_TABLET | Freq: Every evening | ORAL | Status: DC | PRN

## 2014-01-24 NOTE — Telephone Encounter (Signed)
Pt calls and wanted to get a refill on her xanax 2mg  sent to [facility] in [place]. Last seen in October in our office

## 2014-01-24 NOTE — Telephone Encounter (Signed)
Ok to fill 

## 2014-01-31 ENCOUNTER — Ambulatory Visit (INDEPENDENT_AMBULATORY_CARE_PROVIDER_SITE_OTHER): Payer: Medicare Other | Admitting: Family Medicine

## 2014-01-31 ENCOUNTER — Encounter: Payer: Self-pay | Admitting: Family Medicine

## 2014-01-31 VITALS — BP 110/70 | HR 60 | Temp 97.9°F | Wt 127.0 lb

## 2014-01-31 DIAGNOSIS — M7061 Trochanteric bursitis, right hip: Secondary | ICD-10-CM

## 2014-01-31 DIAGNOSIS — M7062 Trochanteric bursitis, left hip: Principal | ICD-10-CM

## 2014-01-31 DIAGNOSIS — M76899 Other specified enthesopathies of unspecified lower limb, excluding foot: Secondary | ICD-10-CM

## 2014-01-31 DIAGNOSIS — S86899A Other injury of other muscle(s) and tendon(s) at lower leg level, unspecified leg, initial encounter: Secondary | ICD-10-CM
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
## 2014-01-31 NOTE — Patient Instructions (Signed)
Shin Splints Shin splints is a painful condition that is felt on the shinbone or in the muscles on either side of the bone (front of your lower leg). Shin splints happen when physical activities, such as sports or other demanding exercise, leads to inflammation of the muscles, tendons, and the thin layer that covers the shinbone.  CAUSES   Overuse of muscles.  Repetitive activities.  Flat feet or rigid arches. Activities that could contribute to shin splints include:  A sudden increase in exercise time.  Starting a new, demanding activity.  Running up hills or long distances.  Playing sports with sudden starts and stops.  A poor warm up.  Old or worn-out shoes. SYMPTOMS   Pain on the front of the leg.  Pain while exercising or at rest. DIAGNOSIS  Your caregiver will diagnose shin splints from a history of your symptoms and a physical exam. You may be observed as you walk or run. X-ray exams or further testing may be needed to rule out other problems, such as a stress fracture, which also causes lower leg pain. TREATMENT  Your caregiver may decide on the treatment based on your age, history, health, and how bad the pain is. Most cases of shin splints can be managed by one or more of the following:  Resting.  Reducing the length and intensity of your exercise.  Stopping the activity that causes shin pain.  Taking medicines to control the inflammation.  Icing, massaging, stretching, and strengthening the affected area.  Getting shoes with rigid heels, shock absorption, and a good arch support. HOME CARE INSTRUCTIONS   Resume activity steadily or as directed by your caregiver.  Restart your exercise sessions with non-weight-bearing exercises, such as cycling or swimming.  Stop running if the pain returns.  Warm up properly before exercising.  Run on a level and fairly firm surface.  Gradually change the intensity of an exercise.  Limit increases in running  distance by no more than 5 to 10% weekly. This means if you are running 5 miles, you can only increase your run by 1/2 a mile at a time.  Change your athletic shoes every 6 months, or every 350 to 450 miles. SEEK MEDICAL CARE IF:   Symptoms continue or worsen even after treatment.  The location, intensity, or type of pain changes over time. SEEK IMMEDIATE MEDICAL CARE IF:   You have severe pain.  You have trouble walking. MAKE SURE YOU:  Understand these instructions.  Will watch your condition.  Will get help right away if you are not doing well or get worse. Document Released: 12/12/2000 Document Revised: 03/08/2012 Document Reviewed: 06/01/2011 [facility] Patient Information 2014 [place], [place]. Hip Bursitis Bursitis is a swelling and soreness (inflammation) of a fluid-filled sac (bursa). This sac overlies and protects the joints.  CAUSES   Injury.  Overuse of the muscles surrounding the joint.  Arthritis.  Gout.  Infection.  Cold weather.  Inadequate warm-up and conditioning prior to activities. The cause may not be known.  SYMPTOMS   Mild to severe irritation.  Tenderness and swelling over the outside of the hip.  Pain with motion of the hip.  If the bursa becomes infected, a fever may be present. Redness, tenderness, and warmth will develop over the hip. Symptoms usually lessen in 3 to 4 weeks with treatment, but can come back. TREATMENT If conservative treatment does not work, your caregiver may advise draining the bursa and injecting cortisone into the area. This may speed up  the healing process. This may also be used as an initial treatment of choice. HOME CARE INSTRUCTIONS   Apply ice to the affected area for 15-20 minutes every 3 to 4 hours while awake for the first 2 days. Put the ice in a plastic bag and place a towel between the bag of ice and your skin.  Rest the painful joint as much as possible, but continue to put the joint through a normal  range of motion at least 4 times per day. When the pain lessens, begin normal, slow movements and usual activities to help prevent stiffness of the hip.  Only take over-the-counter or prescription medicines for pain, discomfort, or fever as directed by your caregiver.  Use crutches to limit weight bearing on the hip joint, if advised.  Elevate your painful hip to reduce swelling. Use pillows for propping and cushioning your legs and hips.  Gentle massage may provide comfort and decrease swelling. SEEK IMMEDIATE MEDICAL CARE IF:   Your pain increases even during treatment, or you are not improving.  You have a fever.  You have heat and inflammation over the involved bursa.  You have any other questions or concerns. MAKE SURE YOU:   Understand these instructions.  Will watch your condition.  Will get help right away if you are not doing well or get worse. Document Released: 06/06/2002 Document Revised: 03/08/2012 Document Reviewed: 01/03/2009 [facility] Patient Information 2014 [place], [place].

## 2014-02-07 ENCOUNTER — Ambulatory Visit: Payer: Medicare Other | Admitting: Family Medicine

## 2014-02-14 ENCOUNTER — Ambulatory Visit: Payer: Medicare Other | Admitting: Family Medicine

## 2014-02-20 ENCOUNTER — Encounter: Payer: Medicare Other | Admitting: Hematology & Oncology

## 2014-02-20 ENCOUNTER — Other Ambulatory Visit: Payer: Medicare Other | Admitting: Lab

## 2014-02-21 NOTE — Progress Notes (Signed)
02-21-14  I called patient today to follow up on her no-show office visit on 02-20-14. Patient stated that she was here at the office and did write her name on the sign in sheet, however, no one  acknowledged that she has arrived. After one hour patient was frustrated,left lobby area and went home. I asked her if she ever went up to the desk to talk to someone, she stated she did not because she had already written her name on the sign in form. I apologized to her for her inconvenience and asked if she would like another appointment, and that Liliane Channel would call her back tomorrow with a new appointment.

## 2014-02-22 ENCOUNTER — Telehealth: Payer: Self-pay | Admitting: Hematology & Oncology

## 2014-02-22 NOTE — Telephone Encounter (Signed)
Pt aware of 3-11

## 2014-02-23 NOTE — Progress Notes (Signed)
This encounter was created in error - please disregard.  This encounter was created in error - please disregard.

## 2014-03-02 ENCOUNTER — Ambulatory Visit (INDEPENDENT_AMBULATORY_CARE_PROVIDER_SITE_OTHER): Payer: Medicare Other | Admitting: Family Medicine

## 2014-03-02 ENCOUNTER — Encounter: Payer: Self-pay | Admitting: Family Medicine

## 2014-03-02 VITALS — BP 110/68 | HR 60 | Wt 130.0 lb

## 2014-03-02 DIAGNOSIS — S161XXA Strain of muscle, fascia and tendon at neck level, initial encounter: Secondary | ICD-10-CM

## 2014-03-02 DIAGNOSIS — M25562 Pain in left knee: Secondary | ICD-10-CM

## 2014-03-02 DIAGNOSIS — E039 Hypothyroidism, unspecified: Secondary | ICD-10-CM | POA: Diagnosis not present

## 2014-03-02 DIAGNOSIS — R635 Abnormal weight gain: Secondary | ICD-10-CM | POA: Diagnosis not present

## 2014-03-02 DIAGNOSIS — M25569 Pain in unspecified knee: Secondary | ICD-10-CM

## 2014-03-02 DIAGNOSIS — S139XXA Sprain of joints and ligaments of unspecified parts of neck, initial encounter: Secondary | ICD-10-CM

## 2014-03-02 DIAGNOSIS — Z13220 Encounter for screening for lipoid disorders: Secondary | ICD-10-CM

## 2014-03-02 DIAGNOSIS — E559 Vitamin D deficiency, unspecified: Secondary | ICD-10-CM | POA: Diagnosis not present

## 2014-03-02 NOTE — Patient Instructions (Signed)
Gluten-Free Diet  Gluten is a protein found in many grains. Gluten is present in wheat, rye, and barley. Gluten from wheat, rye, and barley protein interferes with the absorption of food in people with gluten sensitivity. It may also cause intestinal injury when eaten by individuals with gluten sensitivity.   A sample piece (biopsy) of the small intestine is usually required for a positive diagnosis of gluten sensitivity. Dietary treatment consists of eliminating foods and food ingredients from wheat, rye, and barley. When these are taken out of the diet completely, most people regain function of the small intestine.  Strict compliance is important even during symptom-free periods. People with gluten sensitivity need to be on a gluten-free diet for a lifetime. During the first stages of treatment, some people will also need to restrict dairy products that contain lactose, which is a naturally occurring sugar. Lactose is difficult to absorb when the small intestines are damaged (lactose intolerance).   WHO NEEDS THIS DIET  Some people who have certain diseases need to be on a gluten-free diet. These diseases include:  · Celiac disease.  · Nontropical sprue.  · Gluten-sensitive enteropathy.  · Dermatitis herpetiformis.  SPECIAL NOTES  · Read all labels because gluten may have been added as an incidental ingredient. Words to check for on the label include: flour, starch, durum flour, graham flour, phosphated flour, self-rising flour, semolina, farina, modified food starch, cereal, thickening, fillers, emulsifiers, any kind of malt flavoring, and hydrolyzed vegetable protein. A registered dietician can help you identify possible harmful ingredients in the foods you normally eat.  · If you are not sure whether an ingredient contains gluten, check with the manufacturer. Note that some manufacturers may change ingredients without notice. Always read labels.   · Since flour and cereal products are often used in the  preparation of foods, it is important to be aware of the methods of preparation used, as well as the foods themselves. This is especially true when you are dining out.  Starches  · Allowed: Only those prepared from arrowroot, corn, potato, rice, and bean flours. Rice wafers(*), pure cornmeal tortillas, popcorn, some crackers, and chips(*). Hot cereals made from cornmeal. Ask your dietician which specific hot and cold cereals are allowed. White or sweet potatoes, yams, hominy, rice or wild rice, and special gluten-free pasta. Some oriental rice noodles or bean noodles.  · Avoid: All wheat and rye cereals, wheat germ, barley, bran, graham, malt, bulgur, and millet(-). NOTE: Avoid cereals containing malt as a flavoring, such as rice cereal. Regular noodles, spaghetti, macaroni, and most packaged rice mixes(*). All others containing wheat, rye, or barley.  Vegetables  · Allowed: All plain, fresh, frozen, or canned vegetables.  · Avoid: Creamed vegetables(*) and vegetables canned in sauces(*). Any prepared with wheat, rye, or barley.  Fruit  · Allowed: All fresh, frozen, canned, or dried fruits. Fruit juices.  · Avoid: Thickened or prepared fruits and some pie fillings(*).  Meat and Meat Substitutes  · Allowed: Meat, fish, poultry, or eggs prepared without added wheat, rye, or barley. Luncheon meat(*), frankfurters(*), and pure meat. All aged cheese and processed cheese products(*). Cottage cheese(+) and cream cheese(+). Dried beans, dried peas, and lentils.  · Avoid: Any meat or meat alternate containing wheat, rye, barley, or gluten stabilizers. Bread-containing products, such as Swiss steak, croquettes, and meatloaf. Tuna canned in vegetable broth(*); turkey with HVP injected as part of the basting; any cheese product containing oat gum as an ingredient.  Milk  · Allowed: Milk.   Yogurt made with allowed ingredients(*).  · Avoid: Commercial chocolate milk which may have cereal added(*). Malted milk.  Soups and  Combination Foods  · Allowed: Homemade broth and soups made with allowed ingredients; some canned or frozen soups are allowed(*). Combination or prepared foods that do not contain gluten(*). Read labels.  · Avoid: All soups containing wheat, rye, or barley flour. Bouillon and bouillon cubes that contain hydrolyzed vegetable protein (HVP). Combination or prepared foods that contain gluten(*).  Desserts  · Allowed:  Custard, some pudding mixes(*), homemade puddings from cornstarch, rice, and tapioca. Gelatin desserts, ices, and sherbet(*). Cake, cookies, and other desserts prepared with allowed flours. Some commercial ice creams(*). Ask your dietician about specific brands of dessert that are allowed.  · Avoid: Cakes, cookies, doughnuts, and pastries that are prepared with wheat, rye, or barley flour. Some commercial ice creams(*), ice cream flavors which contain cookies, crumbs, or cheesecake(*). Ice cream cones. All commercially prepared mixes for cakes, cookies, and other desserts(*). Bread pudding and other puddings thickened with flour.  Sweets  · Allowed: Sugar, honey, syrup(*), molasses, jelly, jam, plain hard candy, marshmallows, gumdrops, homemade candies free from wheat, rye, or barley. Coconut.  · Avoid: Commercial candies containing wheat, rye, or barley(*). Certain buttercrunch toffees are dusted with wheat flour. Ask your dietician about specific brands that are not allowed. Chocolate-coated nuts, which are often rolled in flour.  Fats and Oils  · Allowed: Butter, margarine, vegetable oil, sour cream(+), whipping cream, shortening, lard, cream, mayonnaise(*). Some commercial salad dressings(*). Peanut butter.  · Avoid: Some commercial salad dressings(*).  Beverages  · Allowed: Coffee (regular or decaffeinated), tea, herbal tea (read label to be sure that no wheat flour has been added). Carbonated beverages and some root beers(*). Wine, sake, and distilled spirits, such as gin, vodka, and  whiskey.  · Avoid:  Certain cereal beverages. Ask your dietician about specific brands that are not allowed. Beer (unless gluten-free), ale, malted milk, and some root beers, wine, and sake.  Condiments/ Miscellaneous  · Allowed: Salt, pepper, herbs, spices, extracts, and food colorings. Monosodium glutamate (MSG). Cider, rice, and wine vinegar. Baking soda and baking powder. Certain soy sauces. Ask your dietician about specific brands that are allowed. Nuts, coconut, chocolate, and pure cocoa powder.  · Avoid: Some curry powder(*), some dry seasoning mixes(*), some gravy extracts(*), some meat sauces(*), some catsup(*), some prepared mustard(*), horseradish(*), some soy sauce(*), chip dips(*), and some chewing gum(*). Yeast extract (contains barley). Caramel color (may contain malt). Ask your dietician about specific brands of condiments to avoid.  Flour and Thickening Agents  · Allowed: Arrowroot starch (A); Corn bran (B); Corn flour (B,C,D); Corn germ (B); Cornmeal (B,C,D); Corn starch (A); Potato flour (B,C,E); Potato starch flour (B,C,E); Rice bran (B); Rice flours: Plain, brown (B,C,D,E), and Sweet (A,B,C,F). Rice polish (B,C,G); Soy flour (B,C,G); Tapioca starch (A).  The flour and thickening agents described above are good for:  (A) Good thickening agent  (B) Good when combined with other flours  (C) Best combined with milk and eggs in baked products  (D) Best in grainy-textured products  (E) Produces drier product than other flours  (F) Produces moister product than other flours  (G) Adds distinct flavor to product. Use in moderation.  (*) Check labels and investigate any questionable ingredients.   (-) Additional research is needed before this product can be recommended.  (+) Check vegetable gum used.  SAMPLE MEAL PLAN  Breakfast   · Orange juice.  · Banana.  ·   Rice or corn cereal.  · Toast (gluten-free bread).  · Heart-healthy tub margarine.  · Jam.  · Milk.  · Coffee or tea.  Lunch  · Chicken salad  sandwich (with gluten-free bread and mayonnaise).  · Sliced tomatoes.  · Heart-healthy tub margarine.  · Apple.  · Milk.  · Coffee or tea.  Dinner  · Roast beef.  · Baked potato.  · Broccoli.  · Lettuce salad with gluten-free dressing.  · Gluten-free bread.  · Custard.  · Heart-healthy margarine.  · Coffee or tea.  These meal plans are provided as samples. Your daily meal plans will vary.  Document Released: 12/15/2005 Document Revised: 06/15/2012 Document Reviewed: 01/25/2012  [employer]® Patient Information ©2014 [employer].

## 2014-03-02 NOTE — Progress Notes (Signed)
   Subjective:    Patient ID: Ann Guerrero, female    DOB: 10/03/1956, 58 y.o.   MRN: [id number]  HPI  Shin splint and hips are improved. She still gets a little bit of pain in the hips but has been doing her exercises. Shin splints are much better. Day she is complaining mostly of her Left knee being very painful and swollen. No ice or medications.    Also having some Having left sided neck pain. No trauma or trigger. Has been taking shark cartilage which typically helps control her joint pain but says she hasn't been taking it as consistently lately. She think she will start taking it more consistently now. She's not taking any other over-the-counter medications such as anti-inflammatories..    Having occ shooting pain in her left eye occasionally.  Say will happen for several hours.  Has noticed some twitchin g in her eye.  She did mention to her eye doctor though she did not undergo an exam. He said he can certainly be cluster headaches versus something going on with her heart.  Has been gaining weight like ""crazy.  Gets really bloated especially in the evening.  She feels she is retaining fluids.  She avoid salt.  She says she has tried probiotics but doesn't really seem to help. She wants to know what else she can do.  Review of Systems     Objective:   Physical Exam  Constitutional: She appears well-developed and well-nourished.  HENT:  Head: Normocephalic and atraumatic.  Musculoskeletal:  Left knee with NROM.  Nontender. No swelling or edema today. No laxity of the joint. No significant discomfort with valgus or varus stress. Negative McMurray's.  Skin: Skin is warm and dry.  Psychiatric: She has a normal mood and affect. Her behavior is normal.          Assessment & Plan:  Shin splints- are better.  Hip bursitis - Much improved. Continue her exercises.    Left knee pain-  OA vs cartilage tear. Recommend NSAID for 4-5 days for acute imflammation. Can continue shark  cartilage.  Work on gentle range of motion. Not improving may consider referral to orthopedist for further evaluation. She did have to have arthroscopy done on the right knee several years ago.  Left neck strain -Work on gentle range of motion stretches, heating pad and can use anti-inflammatory as needed. Make sure take with food and water and avoid any GI upset.  Hypothyroid - due to recheck level.  Needs refill based on labwork.   Bloating - recommend trial of gluten free diet  Or low residue diet, since probiotics didn't help.  It's unclear if the bloating she is describing is actually edema as she sometimes uses the word swelling or if it's more abdominal GI bleeding.  Vitamin D deficiency-recheck level. Her last level was extremely low. She has been taking her supplements consistently.

## 2014-03-06 ENCOUNTER — Telehealth: Payer: Self-pay | Admitting: Hematology & Oncology

## 2014-03-06 NOTE — Telephone Encounter (Signed)
Pt cx 3-11 moved to 4-24

## 2014-03-07 DIAGNOSIS — Z13220 Encounter for screening for lipoid disorders: Secondary | ICD-10-CM | POA: Diagnosis not present

## 2014-03-07 DIAGNOSIS — E039 Hypothyroidism, unspecified: Secondary | ICD-10-CM | POA: Diagnosis not present

## 2014-03-07 DIAGNOSIS — E559 Vitamin D deficiency, unspecified: Secondary | ICD-10-CM | POA: Diagnosis not present

## 2014-03-08 ENCOUNTER — Other Ambulatory Visit: Payer: Medicare Other | Admitting: Lab

## 2014-03-08 ENCOUNTER — Ambulatory Visit: Payer: Medicare Other | Admitting: Hematology & Oncology

## 2014-03-08 LAB — LIPID PANEL
Cholesterol: 193 mg/dL (ref 0–200)
HDL: 68 mg/dL (ref >39)
LDL CALC: 110 mg/dL — AB (ref 0–99)
Total CHOL/HDL Ratio: 2.8 Ratio
Triglycerides: 77 mg/dL (ref <150)
VLDL: 15 mg/dL (ref 0–40)

## 2014-03-08 LAB — VITAMIN D 25 HYDROXY (VIT D DEFICIENCY, FRACTURES): Vit D, 25-Hydroxy: 47 ng/mL (ref 30–89)

## 2014-03-08 LAB — TSH: TSH: 1.184 u[IU]/mL (ref 0.350–4.500)

## 2014-03-13 ENCOUNTER — Other Ambulatory Visit: Payer: Self-pay | Admitting: *Deleted

## 2014-03-13 MED ORDER — LEVOTHYROXINE SODIUM 25 MCG PO TABS: ORAL_TABLET | ORAL | Status: DC

## 2014-03-21 ENCOUNTER — Encounter: Payer: Self-pay | Admitting: Family Medicine

## 2014-03-21 ENCOUNTER — Ambulatory Visit (INDEPENDENT_AMBULATORY_CARE_PROVIDER_SITE_OTHER): Payer: Medicare Other | Admitting: Family Medicine

## 2014-03-21 VITALS — BP 116/73 | HR 62 | Wt 129.0 lb

## 2014-03-21 DIAGNOSIS — D239 Other benign neoplasm of skin, unspecified: Secondary | ICD-10-CM | POA: Diagnosis not present

## 2014-03-21 DIAGNOSIS — D2370 Other benign neoplasm of skin of unspecified lower limb, including hip: Secondary | ICD-10-CM | POA: Diagnosis not present

## 2014-03-21 DIAGNOSIS — D229 Melanocytic nevi, unspecified: Secondary | ICD-10-CM

## 2014-03-21 NOTE — Patient Instructions (Signed)
Keep foot elevated today.   Watch for any signs of pus or redness around the wound Follow up in 10-14 days to have the suture removed Keep covered for 24 hours. After that can get wet in the shower.  No peroxide, alcohol, or iodine on the area. He can apply Neosporin for a couple of days but after that he need to switch to Vaseline.

## 2014-03-21 NOTE — Progress Notes (Signed)
   Subjective:    Patient ID: Ann Guerrero, female    DOB: 04-21-1956, 58 y.o.   MRN: [id number]  HPI Lesion on left lateral lower leg for 8 years + . The smaller one developed this year.  Iniitally looked purple but now looks black.  Generally has dry skin so scratches but the lesion itself if not itchey.    Insomnia - [name] trying to taper off her sleep medication. [name] down to 1 tab starting Sunday night    Review of Systems     Objective:   Physical Exam        Assessment & Plan:  Atypical nevus-punch biopsy performed a 4 mm punch. Patient tolerated well. Felt when contractions given.  Insomnia-doing well with taper off the medication. Into me to taper.  Punch Biopsy Procedure Note  Pre-operative Diagnosis: Suspicious lesion  Post-operative Diagnosis: same  Locations:left anterior shin  Indications: getting larger, and discoloration  Anesthesia: Lidocaine 1% with epinephrine without added sodium bicarbonate  Procedure Details  History of allergy to iodine: no Patient informed of the risks (including bleeding and infection) and benefits of the  procedure and Verbal informed consent obtained.  The lesion and surrounding area was given a sterile prep using betadyne and draped in the usual sterile fashion. The skin was then stretched perpendicular to the skin tension lines and the lesion removed using the 29mm punch. The resulting ellipse was then closed. The wound was closed with 4-0 Prolene using simple interrupted stitch. Antibiotic ointment and a sterile dressing applied. The specimen was sent for pathologic examination. The patient tolerated the procedure well.  EBL: 2 ml  Findings: Await pathology  Condition: Stable  Complications: none.  Plan: 1. Instructed to keep the wound dry and covered for 24-48h and clean thereafter. 2. Warning signs of infection were reviewed.   3. Recommended that the patient use OTC acetaminophen as needed for pain.  4.  Return for suture removal in 10 days.

## 2014-03-21 NOTE — Addendum Note (Signed)
Addended by: Teddy Spike on: 03/21/2014 03:22 PM   Modules accepted: Orders

## 2014-03-30 ENCOUNTER — Ambulatory Visit: Payer: Medicare Other | Admitting: Family Medicine

## 2014-04-07 ENCOUNTER — Encounter: Payer: Self-pay | Admitting: Family Medicine

## 2014-04-07 ENCOUNTER — Ambulatory Visit (INDEPENDENT_AMBULATORY_CARE_PROVIDER_SITE_OTHER): Payer: Medicare Other | Admitting: Family Medicine

## 2014-04-07 VITALS — BP 117/76 | HR 57 | Wt 131.0 lb

## 2014-04-07 DIAGNOSIS — R319 Hematuria, unspecified: Secondary | ICD-10-CM | POA: Diagnosis not present

## 2014-04-07 DIAGNOSIS — R300 Dysuria: Secondary | ICD-10-CM | POA: Diagnosis not present

## 2014-04-07 DIAGNOSIS — R311 Benign essential microscopic hematuria: Secondary | ICD-10-CM | POA: Insufficient documentation

## 2014-04-07 DIAGNOSIS — R3129 Other microscopic hematuria: Secondary | ICD-10-CM

## 2014-04-07 LAB — POCT URINALYSIS DIPSTICK
Bilirubin, UA: NEGATIVE
GLUCOSE UA: NEGATIVE
Ketones, UA: NEGATIVE
Leukocytes, UA: NEGATIVE
NITRITE UA: NEGATIVE
Protein, UA: NEGATIVE
Spec Grav, UA: 1.01
UROBILINOGEN UA: 0.2
pH, UA: 7

## 2014-04-07 MED ORDER — CIPROFLOXACIN HCL 500 MG PO TABS: 500.0000 mg | ORAL_TABLET | Freq: Two times a day (BID) | ORAL | Status: AC

## 2014-04-07 NOTE — Progress Notes (Signed)
   Subjective:    Patient ID: Ann Guerrero, female    DOB: Jun 04, 1956, 58 y.o.   MRN: [id number]  HPI Urgency and pressure over the suprapubic area for about 4 or 5 days. No hematuria. No fevers chills or sweats. No significant change in back pain with it. Has been leaking this weak.    Review of Systems     Objective:   Physical Exam  Constitutional: [name] oriented to person, place, and time. She appears well-developed and well-nourished.  HENT:  Head: Normocephalic and atraumatic.  Musculoskeletal:  No CVA tenderness  Neurological: [name] alert and oriented to person, place, and time.  Skin: Skin is warm and dry.          Assessment & Plan:  UTI-urinalysis positive for blood. We'll send for culture. Will tx since the weekend.

## 2014-04-09 LAB — URINE CULTURE
COLONY COUNT: NO GROWTH
ORGANISM ID, BACTERIA: NO GROWTH

## 2014-04-18 ENCOUNTER — Telehealth: Payer: Self-pay | Admitting: Hematology & Oncology

## 2014-04-18 NOTE — Telephone Encounter (Signed)
Pt left message cx 4-24. I called her back and asked her to call and reschedule

## 2014-04-21 ENCOUNTER — Other Ambulatory Visit: Payer: Medicare Other | Admitting: Lab

## 2014-04-21 ENCOUNTER — Ambulatory Visit: Payer: Medicare Other | Admitting: Hematology & Oncology

## 2014-06-13 ENCOUNTER — Other Ambulatory Visit: Payer: Self-pay | Admitting: Family Medicine

## 2014-07-11 ENCOUNTER — Ambulatory Visit (INDEPENDENT_AMBULATORY_CARE_PROVIDER_SITE_OTHER): Payer: Medicare Other | Admitting: Sports Medicine

## 2014-07-11 ENCOUNTER — Encounter: Payer: Self-pay | Admitting: Sports Medicine

## 2014-07-11 ENCOUNTER — Ambulatory Visit (INDEPENDENT_AMBULATORY_CARE_PROVIDER_SITE_OTHER): Payer: Medicare Other

## 2014-07-11 VITALS — BP 116/75 | HR 79 | Ht 62.0 in | Wt 136.0 lb

## 2014-07-11 DIAGNOSIS — M170 Bilateral primary osteoarthritis of knee: Secondary | ICD-10-CM | POA: Insufficient documentation

## 2014-07-11 DIAGNOSIS — M1710 Unilateral primary osteoarthritis, unspecified knee: Secondary | ICD-10-CM

## 2014-07-11 DIAGNOSIS — M25469 Effusion, unspecified knee: Secondary | ICD-10-CM | POA: Diagnosis not present

## 2014-07-11 MED ORDER — MELOXICAM 15 MG PO TABS: ORAL_TABLET | ORAL | Status: DC

## 2014-07-11 NOTE — Progress Notes (Signed)
   Subjective:    I'm seeing this patient as a consultation for:  Dr. Joellyn Quails  CC: Knee pain  HPI: For about six months the patient has experienced pain in both knees that is worse on the left side and particularly bad in the mornings. She says that sitting for long periods tends to make it worse when she gets up. She's tried advil and shark cartilage, she says that these provided okay relief in the past, but are no longer working well. Symptoms are moderate, persistent.  Past medical history, Surgical history, Family history not pertinant except as noted below, Social history, Allergies, and medications have been entered into the medical record, reviewed, and no changes needed.    Objective:   General: Well Developed, well nourished, and in no acute distress.  Neuro/Psych: Alert and oriented x3, extra-ocular muscles intact, able to move all 4 extremities, sensation grossly intact. Skin: Warm and dry, no rashes noted.  Respiratory: Not using accessory muscles, speaking in full sentences, trachea midline.  Cardiovascular: Pulses palpable, no extremity edema. Abdomen: Does not appear distended. Left Knee: With slight swelling and effusion; palpable fluid wave   Palpation with no warmth, joint line tenderness, patellar tenderness, or condyle tenderness. ROM full in flexion and extension and lower leg rotation. Ligaments with solid consistent endpoints including ACL, PCL, LCL, MCL. Negative Mcmurray's and Apley's Non painful patellar compression. Patellar glide with crepitus. Patellar and quadriceps tendons unremarkable. Hamstring and quadriceps strength is normal.  Left Foot: No visible erythema or swelling. Range of motion is full in all directions. Strength is 5/5 in all directions. No hallux valgus.  No pes cavus or pes planus. No abnormal callus noted. No pain over the navicular prominence, or base of fifth metatarsal. Tenderness to palpation of the calcaneal insertion of  plantar fascia. No pain at the Achilles insertion. No pain over the calcaneal bursa. No pain of the retrocalcaneal bursa. No tenderness to palpation over the tarsals, metatarsals, or phalanges. No hallux rigidus or limitus. No tenderness palpation over interphalangeal joints. No pain with compression of the metatarsal heads. Neurovascularly intact distally.  Impression and Recommendations:    1) knee pain is most likely due to osteoarthritis. This is consistent with the nature and duration of the pain, negative compression and grind test, and is likely given her age and history of hemachromotosis. We are ordering x-rays to confirm and will prescribe meloxicam and physical therapy.  2) Patient also brought up a pain at the insertion of her plantar fascia into the calcaneus on the left side. The pain is worst in the morning. [name] tender to palpation. She likely has plantar fasciitis and would benefit from additional  physical therapy targeted towards remediating this problem

## 2014-07-11 NOTE — Assessment & Plan Note (Signed)
X-rays, Mobic, formal physical therapy. Hemochromatosis certainly is contributory. She does have a fluid wave, if she fails conservative measures we could certainly do an aspiration and injection, I would send fluid off for crystal analysis. Return in one month.

## 2014-07-13 ENCOUNTER — Ambulatory Visit (INDEPENDENT_AMBULATORY_CARE_PROVIDER_SITE_OTHER): Payer: Medicare Other | Admitting: Physical Therapy

## 2014-07-13 DIAGNOSIS — M25569 Pain in unspecified knee: Secondary | ICD-10-CM

## 2014-07-13 DIAGNOSIS — R269 Unspecified abnormalities of gait and mobility: Secondary | ICD-10-CM

## 2014-07-13 DIAGNOSIS — M1710 Unilateral primary osteoarthritis, unspecified knee: Secondary | ICD-10-CM

## 2014-07-13 DIAGNOSIS — M6281 Muscle weakness (generalized): Secondary | ICD-10-CM

## 2014-07-19 ENCOUNTER — Other Ambulatory Visit: Payer: Self-pay | Admitting: Family Medicine

## 2014-07-24 ENCOUNTER — Telehealth: Payer: Self-pay | Admitting: Hematology & Oncology

## 2014-07-24 NOTE — Telephone Encounter (Signed)
Pt called not feeling good transferred call to RN for triage. Per RN pt needs to be seen this week by NP. Pt aware of 7-30 appointment

## 2014-07-25 ENCOUNTER — Encounter: Payer: Medicare Other | Admitting: Physical Therapy

## 2014-07-27 ENCOUNTER — Ambulatory Visit (HOSPITAL_BASED_OUTPATIENT_CLINIC_OR_DEPARTMENT_OTHER): Payer: Medicare Other | Admitting: Family

## 2014-07-27 ENCOUNTER — Encounter: Payer: Self-pay | Admitting: Family

## 2014-07-27 ENCOUNTER — Other Ambulatory Visit (HOSPITAL_BASED_OUTPATIENT_CLINIC_OR_DEPARTMENT_OTHER): Payer: Medicare Other | Admitting: Lab

## 2014-07-27 ENCOUNTER — Encounter (INDEPENDENT_AMBULATORY_CARE_PROVIDER_SITE_OTHER): Payer: Medicare Other

## 2014-07-27 DIAGNOSIS — R269 Unspecified abnormalities of gait and mobility: Secondary | ICD-10-CM

## 2014-07-27 DIAGNOSIS — M1710 Unilateral primary osteoarthritis, unspecified knee: Secondary | ICD-10-CM

## 2014-07-27 DIAGNOSIS — M6281 Muscle weakness (generalized): Secondary | ICD-10-CM

## 2014-07-27 DIAGNOSIS — M25569 Pain in unspecified knee: Secondary | ICD-10-CM

## 2014-07-27 LAB — CBC WITH DIFFERENTIAL (CANCER CENTER ONLY)
BASO#: 0.1 10*3/uL (ref 0.0–0.2)
BASO%: 1 % (ref 0.0–2.0)
EOS ABS: 0.2 10*3/uL (ref 0.0–0.5)
EOS%: 2.4 % (ref 0.0–7.0)
HEMATOCRIT: 41.9 % (ref 34.8–46.6)
HEMOGLOBIN: 14.2 g/dL (ref 11.6–15.9)
LYMPH#: 2 10*3/uL (ref 0.9–3.3)
LYMPH%: 31.9 % (ref 14.0–48.0)
MCH: 31.6 pg (ref 26.0–34.0)
MCHC: 33.9 g/dL (ref 32.0–36.0)
MCV: 93 fL (ref 81–101)
MONO#: 0.6 10*3/uL (ref 0.1–0.9)
MONO%: 10 % (ref 0.0–13.0)
NEUT%: 54.7 % (ref 39.6–80.0)
NEUTROS ABS: 3.4 10*3/uL (ref 1.5–6.5)
Platelets: 287 10*3/uL (ref 145–400)
RBC: 4.49 10*6/uL (ref 3.70–5.32)
RDW: 13 % (ref 11.1–15.7)
WBC: 6.2 10*3/uL (ref 3.9–10.0)
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
## 2014-07-28 LAB — FERRITIN CHCC: FERRITIN: 88 ng/mL (ref 9–269)

## 2014-07-28 LAB — IRON AND TIBC CHCC
%SAT: 36 % (ref 21–57)
Iron: 103 ug/dL (ref 41–142)
TIBC: 283 ug/dL (ref 236–444)
UIBC: 180 ug/dL (ref 120–384)

## 2014-07-31 ENCOUNTER — Encounter: Payer: Medicare Other | Admitting: Physical Therapy

## 2014-07-31 ENCOUNTER — Encounter (INDEPENDENT_AMBULATORY_CARE_PROVIDER_SITE_OTHER): Payer: Medicare Other | Admitting: Physical Therapy

## 2014-07-31 DIAGNOSIS — M25569 Pain in unspecified knee: Secondary | ICD-10-CM

## 2014-07-31 DIAGNOSIS — M6281 Muscle weakness (generalized): Secondary | ICD-10-CM

## 2014-07-31 DIAGNOSIS — M1710 Unilateral primary osteoarthritis, unspecified knee: Secondary | ICD-10-CM

## 2014-07-31 DIAGNOSIS — R269 Unspecified abnormalities of gait and mobility: Secondary | ICD-10-CM

## 2014-08-01 ENCOUNTER — Encounter: Payer: Self-pay | Admitting: *Deleted

## 2014-08-03 DIAGNOSIS — M25569 Pain in unspecified knee: Secondary | ICD-10-CM

## 2014-08-03 DIAGNOSIS — R269 Unspecified abnormalities of gait and mobility: Secondary | ICD-10-CM

## 2014-08-03 DIAGNOSIS — M6281 Muscle weakness (generalized): Secondary | ICD-10-CM

## 2014-08-07 ENCOUNTER — Encounter (INDEPENDENT_AMBULATORY_CARE_PROVIDER_SITE_OTHER): Payer: Medicare Other

## 2014-08-07 DIAGNOSIS — M6281 Muscle weakness (generalized): Secondary | ICD-10-CM

## 2014-08-07 DIAGNOSIS — R269 Unspecified abnormalities of gait and mobility: Secondary | ICD-10-CM

## 2014-08-07 DIAGNOSIS — M1710 Unilateral primary osteoarthritis, unspecified knee: Secondary | ICD-10-CM

## 2014-08-07 DIAGNOSIS — M25569 Pain in unspecified knee: Secondary | ICD-10-CM

## 2014-08-08 ENCOUNTER — Encounter: Payer: Self-pay | Admitting: Sports Medicine

## 2014-08-08 ENCOUNTER — Ambulatory Visit (INDEPENDENT_AMBULATORY_CARE_PROVIDER_SITE_OTHER): Payer: Medicare Other | Admitting: Sports Medicine

## 2014-08-08 VITALS — BP 110/73 | HR 61 | Ht 61.0 in | Wt 136.0 lb

## 2014-08-08 DIAGNOSIS — M722 Plantar fascial fibromatosis: Secondary | ICD-10-CM

## 2014-08-08 DIAGNOSIS — M1710 Unilateral primary osteoarthritis, unspecified knee: Secondary | ICD-10-CM | POA: Diagnosis not present

## 2014-08-08 DIAGNOSIS — M170 Bilateral primary osteoarthritis of knee: Secondary | ICD-10-CM

## 2014-08-08 MED ORDER — DEVILS CLAW ROOT POWD: Status: DC

## 2014-08-08 MED ORDER — GLUCOSAMINE-CHONDROITIN 750-600 MG PO CHEW: 2.0000 | CHEWABLE_TABLET | Freq: Two times a day (BID) | ORAL | Status: DC

## 2014-08-08 NOTE — Progress Notes (Signed)
  Subjective:    CC: F/U bilateral osteoarthritis of the knees; F/U bilateral plantar fasciitis  HPI: Patient is a 58 year old woman with hemochromotosis, osteoarthritis and plantar fasciitis who comes to the clinic today to follow up on her osteoarthritis and plantar fasciitis. She has been attending physical therapy for the past month, she has not been taking the meloxicam. The physical therapist told her that she had not heard anything regarding the plantar fasciitis, so only the knees were addressed. Patient states that she has had no improvement. She has begun to take shark fin again. She has seen her oncologist regarding her hemochromotosis and was told that she does not need phlebotomy at this time. [name] resistant to injections.  Past medical history, Surgical history, Family history not pertinant except as noted below, Social history, Allergies, and medications have been entered into the medical record, reviewed, and no changes needed.   Review of Systems: No fevers, chills, night sweats, weight loss, chest pain, or shortness of breath.   Objective:    General: Well Developed, well nourished, and in no acute distress.  Neuro: Alert and oriented x3, extra-ocular muscles intact, sensation grossly intact.  HEENT: Normocephalic, atraumatic, pupils equal round reactive to light, neck supple, no masses, no lymphadenopathy, thyroid nonpalpable.  Skin: Warm and dry, no rashes. Cardiac: Regular rate and rhythm, no murmurs rubs or gallops, no lower extremity edema.  Respiratory: Clear to auscultation bilaterally. Not using accessory muscles, speaking in full sentences.  Procedure: Real-time Ultrasound Guided Injection of left knee Device: GE Logiq E  Verbal informed consent obtained.  Time-out conducted.  Noted no overlying erythema, induration, or other signs of local infection.  Skin prepped in a sterile fashion.  Local anesthesia: Topical Ethyl chloride.  With sterile technique and under  real time ultrasound guidance:  2 cc Kenalog 40, 4 cc lidocaine injected easily. Completed without difficulty  Pain immediately resolved suggesting accurate placement of the medication.  Advised to call if fevers/chills, erythema, induration, drainage, or persistent bleeding.  Images permanently stored and available for review in the ultrasound unit.  Impression: Technically successful ultrasound guided injection.  Impression and Recommendations:    Patient is not improved after 1 month of conservative therapy. Administered a kenalog, lidocaine, and bupivocaine injection to the left knee; recommend PT for knees and plantar fasciitis. Patient was also given Pennsaid and recommended glucosamine chondroiton and devils claw

## 2014-08-08 NOTE — Assessment & Plan Note (Signed)
Not much improvement in the left knee osteoarthritis however PT has been working predominantly on her hips. Intra-articular injection with Marcaine, lidocaine, and kenalog. Return in one month. She does have some plantar fasciitis which they will work on more in physical therapy now, if no improvement after a month we can consider interventional treatment.

## 2014-08-09 ENCOUNTER — Encounter (INDEPENDENT_AMBULATORY_CARE_PROVIDER_SITE_OTHER): Payer: Medicare Other

## 2014-08-09 DIAGNOSIS — M1710 Unilateral primary osteoarthritis, unspecified knee: Secondary | ICD-10-CM

## 2014-08-09 DIAGNOSIS — M25569 Pain in unspecified knee: Secondary | ICD-10-CM

## 2014-08-09 DIAGNOSIS — R269 Unspecified abnormalities of gait and mobility: Secondary | ICD-10-CM

## 2014-08-09 DIAGNOSIS — M6281 Muscle weakness (generalized): Secondary | ICD-10-CM

## 2014-08-16 ENCOUNTER — Encounter: Payer: Medicare Other | Admitting: Physical Therapy

## 2014-08-18 ENCOUNTER — Encounter: Payer: Medicare Other | Admitting: Physical Therapy

## 2014-08-25 ENCOUNTER — Encounter (INDEPENDENT_AMBULATORY_CARE_PROVIDER_SITE_OTHER): Payer: Medicare Other

## 2014-08-25 DIAGNOSIS — M1710 Unilateral primary osteoarthritis, unspecified knee: Secondary | ICD-10-CM

## 2014-08-25 DIAGNOSIS — M6281 Muscle weakness (generalized): Secondary | ICD-10-CM

## 2014-08-25 DIAGNOSIS — M25569 Pain in unspecified knee: Secondary | ICD-10-CM

## 2014-08-25 DIAGNOSIS — R269 Unspecified abnormalities of gait and mobility: Secondary | ICD-10-CM

## 2014-08-30 ENCOUNTER — Encounter (INDEPENDENT_AMBULATORY_CARE_PROVIDER_SITE_OTHER): Payer: Medicare Other | Admitting: Physical Therapy

## 2014-08-30 DIAGNOSIS — R269 Unspecified abnormalities of gait and mobility: Secondary | ICD-10-CM

## 2014-08-30 DIAGNOSIS — M6281 Muscle weakness (generalized): Secondary | ICD-10-CM

## 2014-08-30 DIAGNOSIS — M1710 Unilateral primary osteoarthritis, unspecified knee: Secondary | ICD-10-CM

## 2014-08-30 DIAGNOSIS — M25569 Pain in unspecified knee: Secondary | ICD-10-CM

## 2014-09-05 ENCOUNTER — Ambulatory Visit (INDEPENDENT_AMBULATORY_CARE_PROVIDER_SITE_OTHER): Payer: Medicare Other | Admitting: Family Medicine

## 2014-09-05 ENCOUNTER — Encounter: Payer: Self-pay | Admitting: Family Medicine

## 2014-09-05 ENCOUNTER — Encounter: Payer: Self-pay | Admitting: Sports Medicine

## 2014-09-05 ENCOUNTER — Ambulatory Visit (INDEPENDENT_AMBULATORY_CARE_PROVIDER_SITE_OTHER): Payer: Medicare Other | Admitting: Sports Medicine

## 2014-09-05 VITALS — BP 118/73 | HR 64 | Wt 133.0 lb

## 2014-09-05 VITALS — BP 131/84 | HR 62 | Ht 61.0 in | Wt 135.0 lb

## 2014-09-05 DIAGNOSIS — R0789 Other chest pain: Secondary | ICD-10-CM | POA: Diagnosis not present

## 2014-09-05 DIAGNOSIS — K2100 Gastro-esophageal reflux disease with esophagitis, without bleeding: Secondary | ICD-10-CM

## 2014-09-05 DIAGNOSIS — R42 Dizziness and giddiness: Secondary | ICD-10-CM

## 2014-09-05 DIAGNOSIS — M722 Plantar fascial fibromatosis: Secondary | ICD-10-CM | POA: Diagnosis not present

## 2014-09-05 DIAGNOSIS — M170 Bilateral primary osteoarthritis of knee: Secondary | ICD-10-CM

## 2014-09-05 MED ORDER — OMEPRAZOLE 40 MG PO CPDR: 40.0000 mg | DELAYED_RELEASE_CAPSULE | Freq: Every day | ORAL | Status: DC

## 2014-09-05 MED ORDER — LEVOTHYROXINE SODIUM 25 MCG PO TABS: ORAL_TABLET | ORAL | Status: DC

## 2014-09-05 NOTE — Progress Notes (Signed)
   Subjective:    Patient ID: Ann Guerrero, female    DOB: Apr 04, 1956, 58 y.o.   MRN: [id number]  Chest Pain  Associated symptoms include dizziness.  Dizziness Associated symptoms include chest pain.   CP x 1-2 months  Dizziness started yesterday.  No dizziness today.  Says didn't feel lightheaded.  Has a pulsing sensation in her right nostril.  Left arm occ goes numb but fels like it is related to her nerve damage.  Says the chest pain is like a squeezing sensation in the mid sternum and under the left breast. Can last minutes and can happen on and off during the daytime.  Has had more heartburn and indigestion. Has been eating a lot of Tums.  Says has frequent sweats but not ned with the chest pain. No SOB with the episodes.  No known triggers. No worsening or alleviating factors. He can happen at rest or with activity.   Review of Systems  Cardiovascular: Positive for chest pain.  Neurological: Positive for dizziness.       Objective:   Physical Exam  Constitutional: [name] oriented to person, place, and time. She appears well-developed and well-nourished.  HENT:  Head: Normocephalic and atraumatic.  Right Ear: External ear normal.  Left Ear: External ear normal.  Nose: Nose normal.  Mouth/Throat: Oropharynx is clear and moist.  TMs and canals are clear.   Eyes: Conjunctivae and EOM are normal. Pupils are equal, round, and reactive to light.  Neck: Neck supple. No thyromegaly present.  Cardiovascular: Normal rate, regular rhythm and normal heart sounds.   Pulmonary/Chest: Effort normal and breath sounds normal. She has no wheezes. She exhibits no tenderness.  Abdominal: Soft. Bowel sounds are normal. She exhibits no distension and no mass. There is no tenderness. There is no rebound and no guarding.  Lymphadenopathy:    She has no cervical adenopathy.  Neurological: [name] alert and oriented to person, place, and time.  Skin: Skin is warm and dry.  Psychiatric: She has a  normal mood and affect.          Assessment & Plan:  Atypical Chest Pain - NSR with rate of 62 bpm, no acute changes. Normal axis. . Reassuring. We will do some cardiac enzymes but I think the possibility of actual cardiac disease is low. She's also been having frequent reflux symptoms and has been taking a lot of TUMS so we're going to focus on treating her GERD with omeprazole. If she's not noticing improvement over the next one to 2 weeks and please give me a call back. Certainly if the pain worsens or is triggered by activity or she develop shortness of breath calls back sooner.  GERD-will treat with omeprazole 40 mg once daily. Recommend treatment for 4-8 weeks and then taper off. Additional information provided for reflux diet.  Dizziness - unclear etiology. No fluid on the ears today. She does not feel dizzy today. Blood pressure looks fantastic. We'll continue to monitor carefully.

## 2014-09-05 NOTE — Assessment & Plan Note (Signed)
Resolved after injection 

## 2014-09-05 NOTE — Progress Notes (Signed)
Patient ID: Ann Guerrero, female   DOB: November 17, 1956, 58 y.o.   MRN: [id number]  Subjective:    CC: F/U osteoarthritis after left knee injection, F/U left plantar fasciitis   HPI: Ann Guerrero is a very pleasant 58 year old female with history of bilateral knee osteoarthritis, left worse than right, and left-sided plantar fasciitis who presents for follow-up one month after left knee injection. She states that her knee was completely pain-free 2 days after injection and remains roughly 95% improved. Plantar fasciitis was also somewhat improved after knee injection, but has since worsened. Worse with the first few steps in the morning or after extended inactivity. Physical therapy of the plantar fascia has not yet provided relief. Currently trying topical "deep cream" and "center oil" for foot pain.  Past medical history, Surgical history, Family history not pertinant except as noted below, Social history, Allergies, and medications have been entered into the medical record, reviewed, and no changes needed.   Review of Systems: No fevers, chills, night sweats, weight loss, chest pain, or shortness of breath.   Objective:    General: Well developed, well nourished, and in no acute distress.  Neuro: Alert and oriented x3, extra-ocular muscles intact, sensation grossly intact.  HEENT: Normocephalic, atraumatic, pupils equal round reactive to light, neck supple, no masses, no lymphadenopathy, thyroid nonpalpable.  Skin: Warm and dry, no rashes. Cardiac: Regular rate and rhythm, no murmurs rubs or gallops, no lower extremity edema.  Respiratory: Clear to auscultation bilaterally. Not using accessory muscles, speaking in full sentences.  Left Knee: Normal to inspection with no erythema or effusion or obvious bony abnormalities. Some patellar tenderness present. Otherwise, palpation normal with no warmth, joint line tenderness or condyle tenderness. ROM full in flexion and extension and lower leg  rotation. Ligaments with solid consistent endpoints including ACL, PCL, LCL, MCL. Negative Mcmurray's, Apley's, and Thessalonian tests. Non painful patellar compression. Patellar glide with crepitus. Patellar and quadriceps tendons unremarkable. Hamstring and quadriceps strength is normal.   Left Foot: No visible erythema or swelling. Range of motion is full in all directions. Strength is 5/5 in all directions. No hallux valgus. Pes cavus noted. No abnormal callus noted. No pain over the navicular prominence, or base of fifth metatarsal. Tenderness to palpation of the calcaneal insertion of plantar fascia. No pain at the Achilles insertion. No pain over the calcaneal bursa. No pain of the retrocalcaneal bursa. No tenderness to palpation over the tarsals, metatarsals, or phalanges. No hallux rigidus or limitus. No tenderness palpation over interphalangeal joints. No pain with compression of the metatarsal heads. Neurovascularly intact distally.  Procedure: Real-time Ultrasound Guided Injection of left plantar fascia Device: GE Logiq E  Verbal informed consent obtained.  Time-out conducted.  Noted no overlying erythema, induration, or other signs of local infection.  Skin prepped in a sterile fashion.  Local anesthesia: Topical Ethyl chloride.  With sterile technique and under real time ultrasound guidance:  25-gauge needle advanced to the calcaneal insertion of the plantar fascia, 1 cc Kenalog 40, 2 cc lidocaine injected easily. Completed without difficulty  Pain immediately resolved suggesting accurate placement of the medication.  Advised to call if fevers/chills, erythema, induration, drainage, or persistent bleeding.  Images permanently stored and available for review in the ultrasound unit.  Impression: Technically successful ultrasound guided injection.  Impression and Recommendations:   Left knee osteoarthritis: Pain of the left knee remains significantly improved after  injection. - Continue formal PT  Left plantar fasciitis: While the patient improved slightly due to  steroid effect from her knee injection, the pain has since returned and is not made better with holistic remedies or formal physical therapy; therefore, injection is now appropriate.  - Continue formal PT - Injection today - Return for custom orthotics

## 2014-09-05 NOTE — Patient Instructions (Signed)
If not better in 1-2 weeks on the omeprazole then please let me know.     Gastroesophageal Reflux Disease, Adult Gastroesophageal reflux disease (GERD) happens when acid from your stomach flows up into the esophagus. When acid comes in contact with the esophagus, the acid causes soreness (inflammation) in the esophagus. Over time, GERD may create small holes (ulcers) in the lining of the esophagus. CAUSES   Increased body weight. This puts pressure on the stomach, making acid rise from the stomach into the esophagus.  Smoking. This increases acid production in the stomach.  Drinking alcohol. This causes decreased pressure in the lower esophageal sphincter (valve or ring of muscle between the esophagus and stomach), allowing acid from the stomach into the esophagus.  Late evening meals and a full stomach. This increases pressure and acid production in the stomach.  A malformed lower esophageal sphincter. Sometimes, no cause is found. SYMPTOMS   Burning pain in the lower part of the mid-chest behind the breastbone and in the mid-stomach area. This may occur twice a week or more often.  Trouble swallowing.  Sore throat.  Dry cough.  Asthma-like symptoms including chest tightness, shortness of breath, or wheezing. DIAGNOSIS  Your caregiver may be able to diagnose GERD based on your symptoms. In some cases, X-rays and other tests may be done to check for complications or to check the condition of your stomach and esophagus. TREATMENT  Your caregiver may recommend over-the-counter or prescription medicines to help decrease acid production. Ask your caregiver before starting or adding any new medicines.  HOME CARE INSTRUCTIONS   Change the factors that you can control. Ask your caregiver for guidance concerning weight loss, quitting smoking, and alcohol consumption.  Avoid foods and drinks that make your symptoms worse, such as:  Caffeine or alcoholic  drinks.  Chocolate.  Peppermint or mint flavorings.  Garlic and onions.  Spicy foods.  Citrus fruits, such as oranges, lemons, or limes.  Tomato-based foods such as sauce, chili, salsa, and pizza.  Fried and fatty foods.  Avoid lying down for the 3 hours prior to your bedtime or prior to taking a nap.  Eat small, frequent meals instead of large meals.  Wear loose-fitting clothing. Do not wear anything tight around your waist that causes pressure on your stomach.  Raise the head of your bed 6 to 8 inches with wood blocks to help you sleep. Extra pillows will not help.  Only take over-the-counter or prescription medicines for pain, discomfort, or fever as directed by your caregiver.  Do not take aspirin, ibuprofen, or other nonsteroidal anti-inflammatory drugs (NSAIDs). SEEK IMMEDIATE MEDICAL CARE IF:   You have pain in your arms, neck, jaw, teeth, or back.  Your pain increases or changes in intensity or duration.  You develop nausea, vomiting, or sweating (diaphoresis).  You develop shortness of breath, or you faint.  Your vomit is green, yellow, black, or looks like coffee grounds or blood.  Your stool is red, bloody, or black. These symptoms could be signs of other problems, such as heart disease, gastric bleeding, or esophageal bleeding. MAKE SURE YOU:   Understand these instructions.  Will watch your condition.  Will get help right away if you are not doing well or get worse. Document Released: 09/24/2005 Document Revised: 03/08/2012 Document Reviewed: 07/04/2011 [facility] Patient Information 2015 [place], [place]. This information is not intended to replace advice given to you by your health care provider. Make sure you discuss any questions you have with your health  care provider.  

## 2014-09-05 NOTE — Assessment & Plan Note (Signed)
Improved slightly due to steroid effect from the injection however still painful. Continue rehabilitation, injection as above, return for custom orthotics.

## 2014-09-06 LAB — COMPLETE METABOLIC PANEL WITH GFR
ALT: 16 U/L (ref 0–35)
AST: 19 U/L (ref 0–37)
Albumin: 4.4 g/dL (ref 3.5–5.2)
Alkaline Phosphatase: 61 U/L (ref 39–117)
BILIRUBIN TOTAL: 0.5 mg/dL (ref 0.2–1.2)
BUN: 14 mg/dL (ref 6–23)
CHLORIDE: 104 meq/L (ref 96–112)
CO2: 24 mEq/L (ref 19–32)
CREATININE: 0.7 mg/dL (ref 0.50–1.10)
Calcium: 9.6 mg/dL (ref 8.4–10.5)
GFR, Est African American: >89 mL/min
GFR, Est Non African American: >89 mL/min
Glucose, Bld: 95 mg/dL (ref 70–99)
Potassium: 4.7 mEq/L (ref 3.5–5.3)
Sodium: 137 mEq/L (ref 135–145)
Total Protein: 7.1 g/dL (ref 6.0–8.3)

## 2014-09-07 LAB — TROPONIN I: Troponin I: <0.01 ng/mL (ref <0.06)

## 2014-09-07 LAB — CK TOTAL AND CKMB (NOT AT ARMC)
CK TOTAL: 93 U/L (ref 7–177)
CK, MB: <0.7 ng/mL (ref 0.0–5.0)

## 2014-09-10 NOTE — Addendum Note (Signed)
Addended by: Narda Rutherford on: 09/10/2014 10:47 AM   Modules accepted: Orders

## 2014-09-11 ENCOUNTER — Ambulatory Visit: Payer: Medicare Other | Admitting: Family Medicine

## 2014-09-12 ENCOUNTER — Encounter: Payer: Medicare Other | Admitting: Sports Medicine

## 2014-09-12 ENCOUNTER — Encounter: Payer: Medicare Other | Admitting: Physical Therapy

## 2014-09-12 ENCOUNTER — Ambulatory Visit (INDEPENDENT_AMBULATORY_CARE_PROVIDER_SITE_OTHER): Payer: Medicare Other | Admitting: Sports Medicine

## 2014-09-12 ENCOUNTER — Encounter: Payer: Self-pay | Admitting: Sports Medicine

## 2014-09-12 VITALS — BP 109/65 | HR 54 | Wt 133.0 lb

## 2014-09-12 DIAGNOSIS — M722 Plantar fascial fibromatosis: Secondary | ICD-10-CM | POA: Diagnosis not present

## 2014-09-12 NOTE — Progress Notes (Signed)

## 2014-09-12 NOTE — Assessment & Plan Note (Signed)
Resolved after injection, custom orthotics as above.

## 2014-09-14 ENCOUNTER — Other Ambulatory Visit (HOSPITAL_BASED_OUTPATIENT_CLINIC_OR_DEPARTMENT_OTHER): Payer: Medicare Other | Admitting: Lab

## 2014-09-14 ENCOUNTER — Ambulatory Visit (HOSPITAL_BASED_OUTPATIENT_CLINIC_OR_DEPARTMENT_OTHER): Payer: Medicare Other | Admitting: Hematology & Oncology

## 2014-09-14 ENCOUNTER — Encounter: Payer: Self-pay | Admitting: Hematology & Oncology

## 2014-09-14 ENCOUNTER — Other Ambulatory Visit: Payer: Medicare Other | Admitting: Lab

## 2014-09-14 ENCOUNTER — Ambulatory Visit: Payer: Medicare Other | Admitting: Hematology & Oncology

## 2014-09-14 LAB — CBC WITH DIFFERENTIAL (CANCER CENTER ONLY)
BASO#: 0.1 10*3/uL (ref 0.0–0.2)
BASO%: 0.5 % (ref 0.0–2.0)
EOS ABS: 0 10*3/uL (ref 0.0–0.5)
EOS%: 0.4 % (ref 0.0–7.0)
HCT: 43.5 % (ref 34.8–46.6)
HGB: 15 g/dL (ref 11.6–15.9)
LYMPH#: 2.2 10*3/uL (ref 0.9–3.3)
LYMPH%: 21.2 % (ref 14.0–48.0)
MCH: 32.3 pg (ref 26.0–34.0)
MCHC: 34.5 g/dL (ref 32.0–36.0)
MCV: 94 fL (ref 81–101)
MONO#: 1.2 10*3/uL — AB (ref 0.1–0.9)
MONO%: 11.1 % (ref 0.0–13.0)
NEUT%: 66.8 % (ref 39.6–80.0)
NEUTROS ABS: 7 10*3/uL — AB (ref 1.5–6.5)
Platelets: 389 10*3/uL (ref 145–400)
RBC: 4.65 10*6/uL (ref 3.70–5.32)
RDW: 13.5 % (ref 11.1–15.7)
WBC: 10.4 10*3/uL — AB (ref 3.9–10.0)

## 2014-09-14 LAB — IRON AND TIBC CHCC
%SAT: 44 % (ref 21–57)
Iron: 127 ug/dL (ref 41–142)
TIBC: 289 ug/dL (ref 236–444)
UIBC: 162 ug/dL (ref 120–384)

## 2014-09-14 LAB — FERRITIN CHCC: FERRITIN: 96 ng/mL (ref 9–269)

## 2014-09-14 NOTE — Progress Notes (Signed)
Hematology and Oncology Follow Up Visit  Ann Guerrero [id number] Apr 01, 1956 58 y.o. 09/14/2014   Principle Diagnosis:  [name]  (heterozygous for C282Y mutation) Current Therapy:    Phlebotomy  to maintain ferritin less than 100     Interim History:  Ann Guerrero is back for followup. We last saw her back in July. She's been doing pretty well. She has not had any problems with the hemachromatosis. She's had no abdominal pain. She's had no change in bowel or bladder habits. She had no cough.  Last saw her in July, her ferritin was 88.  She's had no bleeding or bruising. She's had no headache. She's had no joint swelling.    Medications: Current outpatient prescriptions:alprazolam (XANAX) 2 MG tablet, TAKE ONE TABLET BY MOUTH AT BEDTIME AS NEEDED, Disp: 30 tablet, Rfl: 0;  calcium citrate-vitamin D 200-200 MG-UNIT TABS, Take 1 tablet by mouth daily. 2 tabs in am and 2 tabs in pm, Disp: , Rfl: ;  Cholecalciferol (VITAMIN D3) 1000 UNITS CAPS, Take by mouth 2 (two) times daily.  , Disp: , Rfl: ;  IODINE, KELP, PO, Take 1 drop by mouth., Disp: , Rfl:  levothyroxine (SYNTHROID, LEVOTHROID) 25 MCG tablet, TAKE ONE TABLET BY MOUTH ONCE DAILY, Disp: 90 tablet, Rfl: 1;  omeprazole (PRILOSEC) 40 MG capsule, Take 1 capsule (40 mg total) by mouth daily., Disp: 30 capsule, Rfl: 3  Allergies:  Allergies  Allergen Reactions  . Codeine Nausea And Vomiting  . Celebrex [Celecoxib] Other (See Comments)    Severe HA  . Erythromycin     REACTION: Hives  . Milnacipran Hcl Other (See Comments)    Nausea or fever    Past Medical History, Surgical history, Social history, and Family History were reviewed and updated.  Review of Systems: As above  Physical Exam:  height is 5\' 1"  (1.549 m) and weight is 132 lb (59.875 kg). Her oral temperature is 97.9 F (36.6 C). Her blood pressure is 117/62 and her pulse is 55. Her respiration is 14.   Well-developed and well-nourished white female. Head  and neck exam shows no ocular or oral lesions. There are no palpable cervical or supraclavicular lymph nodes. Lungs are clear. Cardiac exam regular in rhythm with no murmurs, rubs or bruits. Abdomen soft. Has good bowel sounds. There is no fluid wave. There is no palpable liver or spleen tip. Back exam shows no tenderness over the spine, ribs or hips. Extremities shows no clubbing, cyanosis or edema. Neurological exam shows no focal neurological deficits.  Lab Results  Component Value Date   WBC 10.4* 09/14/2014   HGB 15.0 09/14/2014   HCT 43.5 09/14/2014   MCV 94 09/14/2014   PLT 389 09/14/2014     Chemistry      Component Value Date/Time   NA 137 09/05/2014 0844   NA 139 11/28/2011   K 4.7 09/05/2014 0844   CL 104 09/05/2014 0844   CO2 24 09/05/2014 0844   BUN 14 09/05/2014 0844   BUN 11 11/28/2011   CREATININE 0.70 09/05/2014 0844   CREATININE 0.7 11/28/2011   CREATININE 0.66 02/03/2008 1916   GLU 84 11/28/2011      Component Value Date/Time   CALCIUM 9.6 09/05/2014 0844   ALKPHOS 61 09/05/2014 0844   AST 19 09/05/2014 0844   ALT 16 09/05/2014 0844   BILITOT 0.5 09/05/2014 0844       Ferritin is 96. Iron saturation is 44%.  Impression and Plan: Ann Guerrero is 58 year old  female. [name] hemachromatosis. We've not phlebotomize her for close to 2 years. I think that would probably have to phlebotomize her now. [name] close enough to 100 that we shouldn't phlebotomize her.  Otherwise, I do not see any other issues.  We will plan to get her back to see [place] in about 3 or 4 months.   Volanda Napoleon, MD 9/17/20159:15 AM

## 2014-09-15 ENCOUNTER — Other Ambulatory Visit: Payer: Self-pay | Admitting: Family Medicine

## 2014-09-15 ENCOUNTER — Encounter: Payer: Medicare Other | Admitting: Physical Therapy

## 2014-09-19 ENCOUNTER — Encounter: Payer: Medicare Other | Admitting: Physical Therapy

## 2014-09-21 ENCOUNTER — Encounter: Payer: Medicare Other | Admitting: Physical Therapy

## 2014-09-26 ENCOUNTER — Ambulatory Visit (HOSPITAL_BASED_OUTPATIENT_CLINIC_OR_DEPARTMENT_OTHER): Payer: Medicare Other

## 2014-09-26 ENCOUNTER — Encounter: Payer: Medicare Other | Admitting: Physical Therapy

## 2014-09-26 NOTE — Patient Instructions (Signed)

## 2014-09-26 NOTE — Progress Notes (Signed)
Ann Guerrero presents today for phlebotomy per MD orders. Phlebotomy procedure started at 0915 and ended at 0930. 500 ml  removed. Patient observed for 30 minutes after procedure without any incident. Patient tolerated procedure well. IV needle removed intact.

## 2014-09-28 ENCOUNTER — Encounter: Payer: Medicare Other | Admitting: Physical Therapy

## 2014-11-21 ENCOUNTER — Encounter: Payer: Self-pay | Admitting: Sports Medicine

## 2014-11-21 ENCOUNTER — Ambulatory Visit (INDEPENDENT_AMBULATORY_CARE_PROVIDER_SITE_OTHER): Payer: Medicare Other | Admitting: Sports Medicine

## 2014-11-21 VITALS — BP 114/68 | HR 57 | Ht 60.05 in | Wt 134.0 lb

## 2014-11-21 DIAGNOSIS — M170 Bilateral primary osteoarthritis of knee: Secondary | ICD-10-CM | POA: Diagnosis not present

## 2014-11-21 NOTE — Progress Notes (Signed)
  Subjective:    CC: follow-up  HPI: Left knee osteoarthritis: It has been approximately 3-4 months since her last injection, now having recurrence of pain. Moderate, persistent and localized at the joint lines.  Past medical history, Surgical history, Family history not pertinant except as noted below, Social history, Allergies, and medications have been entered into the medical record, reviewed, and no changes needed.   Review of Systems: No fevers, chills, night sweats, weight loss, chest pain, or shortness of breath.   Objective:    General: Well Developed, well nourished, and in no acute distress.  Neuro: Alert and oriented x3, extra-ocular muscles intact, sensation grossly intact.  HEENT: Normocephalic, atraumatic, pupils equal round reactive to light, neck supple, no masses, no lymphadenopathy, thyroid nonpalpable.  Skin: Warm and dry, no rashes. Cardiac: Regular rate and rhythm, no murmurs rubs or gallops, no lower extremity edema.  Respiratory: Clear to auscultation bilaterally. Not using accessory muscles, speaking in full sentences.  Procedure: Real-time Ultrasound Guided Injection of left knee Device: GE Logiq E  Verbal informed consent obtained.  Time-out conducted.  Noted no overlying erythema, induration, or other signs of local infection.  Skin prepped in a sterile fashion.  Local anesthesia: Topical Ethyl chloride.  With sterile technique and under real time ultrasound guidance:  2 mL kenalog 40, 4 mL lidocaine injected easily. Completed without difficulty  Pain immediately resolved suggesting accurate placement of the medication.  Advised to call if fevers/chills, erythema, induration, drainage, or persistent bleeding.  Images permanently stored and available for review in the ultrasound unit.  Impression: Technically successful ultrasound guided injection.  Impression and Recommendations:

## 2014-11-21 NOTE — Assessment & Plan Note (Signed)
Right knee is doing okay, left knee is hurting again. Last injection was 3-4 months ago. Repeat injection today, and we are going to get her approved for Orthovisc.

## 2014-11-22 ENCOUNTER — Telehealth: Payer: Self-pay | Admitting: *Deleted

## 2014-11-22 NOTE — Telephone Encounter (Signed)
I spoke with Ann Guerrero today to get her scheduled for injections and she informed me that she wants to wait until the first of the year; after the holidays, for injections. I told her that she was approved thru her insurance and she said that after the beginning of the year she was going to have a different supplements to her medicare and I explained to her that was fine just to call either the front desk for an appt for injection or myself. She verbalized understanding. Margette Fast, CMA

## 2014-11-28 ENCOUNTER — Ambulatory Visit (INDEPENDENT_AMBULATORY_CARE_PROVIDER_SITE_OTHER): Payer: Medicare Other

## 2014-11-28 ENCOUNTER — Ambulatory Visit (INDEPENDENT_AMBULATORY_CARE_PROVIDER_SITE_OTHER): Payer: Medicare Other | Admitting: Physician Assistant

## 2014-11-28 ENCOUNTER — Ambulatory Visit: Payer: Medicare Other | Admitting: Physician Assistant

## 2014-11-28 ENCOUNTER — Encounter: Payer: Self-pay | Admitting: Physician Assistant

## 2014-11-28 VITALS — BP 117/76 | HR 83 | Temp 98.2°F | Ht 60.0 in | Wt 133.0 lb

## 2014-11-28 DIAGNOSIS — R1012 Left upper quadrant pain: Secondary | ICD-10-CM

## 2014-11-28 DIAGNOSIS — R109 Unspecified abdominal pain: Secondary | ICD-10-CM

## 2014-11-28 DIAGNOSIS — R0781 Pleurodynia: Secondary | ICD-10-CM | POA: Diagnosis not present

## 2014-11-28 DIAGNOSIS — S299XXA Unspecified injury of thorax, initial encounter: Secondary | ICD-10-CM | POA: Diagnosis not present

## 2014-11-28 DIAGNOSIS — J40 Bronchitis, not specified as acute or chronic: Secondary | ICD-10-CM

## 2014-11-28 DIAGNOSIS — K7689 Other specified diseases of liver: Secondary | ICD-10-CM

## 2014-11-28 DIAGNOSIS — D1803 Hemangioma of intra-abdominal structures: Secondary | ICD-10-CM | POA: Insufficient documentation

## 2014-11-28 LAB — COMPLETE METABOLIC PANEL WITH GFR
ALBUMIN: 3.8 g/dL (ref 3.5–5.2)
ALT: 11 U/L (ref 0–35)
AST: 15 U/L (ref 0–37)
Alkaline Phosphatase: 73 U/L (ref 39–117)
BUN: 6 mg/dL (ref 6–23)
CALCIUM: 8.9 mg/dL (ref 8.4–10.5)
CHLORIDE: 101 meq/L (ref 96–112)
CO2: 29 meq/L (ref 19–32)
Creat: 0.66 mg/dL (ref 0.50–1.10)
Glucose, Bld: 102 mg/dL — ABNORMAL HIGH (ref 70–99)
POTASSIUM: 4.7 meq/L (ref 3.5–5.3)
Sodium: 138 mEq/L (ref 135–145)
Total Bilirubin: 0.6 mg/dL (ref 0.2–1.2)
Total Protein: 6.7 g/dL (ref 6.0–8.3)

## 2014-11-28 LAB — POCT URINALYSIS DIPSTICK
BILIRUBIN UA: NEGATIVE
GLUCOSE UA: NEGATIVE
Ketones, UA: NEGATIVE
Leukocytes, UA: NEGATIVE
Nitrite, UA: NEGATIVE
Protein, UA: NEGATIVE
Spec Grav, UA: 1.01
Urobilinogen, UA: 0.2
pH, UA: 7

## 2014-11-28 LAB — LIPASE

## 2014-11-28 MED ORDER — RANITIDINE HCL 150 MG PO CAPS: 150.0000 mg | ORAL_CAPSULE | Freq: Two times a day (BID) | ORAL | Status: DC
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
## 2014-11-29 ENCOUNTER — Other Ambulatory Visit: Payer: Self-pay | Admitting: Physician Assistant

## 2014-11-29 ENCOUNTER — Ambulatory Visit: Payer: Medicare Other | Admitting: Physician Assistant

## 2014-11-29 LAB — CBC WITH DIFFERENTIAL/PLATELET
Basophils Absolute: 0 10*3/uL (ref 0.0–0.1)
Basophils Relative: 0 % (ref 0–1)
EOS ABS: 0.2 10*3/uL (ref 0.0–0.7)
EOS PCT: 1 % (ref 0–5)
HCT: 41.9 % (ref 36.0–46.0)
Hemoglobin: 14.7 g/dL (ref 12.0–15.0)
Lymphocytes Relative: 11 % — ABNORMAL LOW (ref 12–46)
Lymphs Abs: 2.1 10*3/uL (ref 0.7–4.0)
MCH: 31.6 pg (ref 26.0–34.0)
MCHC: 35.1 g/dL (ref 30.0–36.0)
MCV: 90.1 fL (ref 78.0–100.0)
MPV: 9.6 fL (ref 9.4–12.4)
Monocytes Absolute: 1.7 10*3/uL — ABNORMAL HIGH (ref 0.1–1.0)
Monocytes Relative: 9 % (ref 3–12)
Neutro Abs: 15.2 10*3/uL — ABNORMAL HIGH (ref 1.7–7.7)
Neutrophils Relative %: 79 % — ABNORMAL HIGH (ref 43–77)
PLATELETS: 373 10*3/uL (ref 150–400)
RBC: 4.65 MIL/uL (ref 3.87–5.11)
RDW: 13.2 % (ref 11.5–15.5)
WBC: 19.3 10*3/uL — ABNORMAL HIGH (ref 4.0–10.5)

## 2014-11-29 LAB — H. PYLORI ANTIBODY, IGG: H Pylori IgG: 0.41 {ISR}

## 2014-11-29 MED ORDER — METRONIDAZOLE 500 MG PO TABS: 500.0000 mg | ORAL_TABLET | Freq: Three times a day (TID) | ORAL | Status: DC

## 2014-11-29 MED ORDER — CIPROFLOXACIN HCL 500 MG PO TABS: 500.0000 mg | ORAL_TABLET | Freq: Two times a day (BID) | ORAL | Status: DC

## 2014-11-29 NOTE — Progress Notes (Signed)
Call pt: discussed elevated WBC. Pt does report that pain has started to progress into the lower left abdomen now and ran a fever of 101 last night. Treating for diverticulitis.

## 2014-12-06 ENCOUNTER — Other Ambulatory Visit: Payer: Self-pay

## 2014-12-06 MED ORDER — ALPRAZOLAM 2 MG PO TABS: 2.0000 mg | ORAL_TABLET | Freq: Every evening | ORAL | Status: DC | PRN

## 2014-12-06 MED ORDER — RANITIDINE HCL 150 MG PO CAPS: 150.0000 mg | ORAL_CAPSULE | Freq: Two times a day (BID) | ORAL | Status: DC

## 2014-12-06 MED ORDER — LEVOTHYROXINE SODIUM 25 MCG PO TABS: ORAL_TABLET | ORAL | Status: DC

## 2014-12-08 ENCOUNTER — Encounter: Payer: Self-pay | Admitting: Family Medicine

## 2014-12-08 ENCOUNTER — Ambulatory Visit (INDEPENDENT_AMBULATORY_CARE_PROVIDER_SITE_OTHER): Payer: Medicare Other | Admitting: Family Medicine

## 2014-12-08 VITALS — BP 107/72 | HR 62 | Temp 97.5°F | Wt 131.0 lb

## 2014-12-08 DIAGNOSIS — K5901 Slow transit constipation: Secondary | ICD-10-CM | POA: Diagnosis not present

## 2014-12-08 DIAGNOSIS — K2100 Gastro-esophageal reflux disease with esophagitis, without bleeding: Secondary | ICD-10-CM

## 2014-12-08 DIAGNOSIS — K5732 Diverticulitis of large intestine without perforation or abscess without bleeding: Secondary | ICD-10-CM | POA: Diagnosis not present

## 2014-12-08 DIAGNOSIS — K769 Liver disease, unspecified: Secondary | ICD-10-CM | POA: Insufficient documentation

## 2014-12-08 DIAGNOSIS — K7689 Other specified diseases of liver: Secondary | ICD-10-CM | POA: Diagnosis not present

## 2014-12-08 DIAGNOSIS — Z1211 Encounter for screening for malignant neoplasm of colon: Secondary | ICD-10-CM

## 2014-12-08 NOTE — Progress Notes (Signed)
   Subjective:    Patient ID: Ann Guerrero, female    DOB: October 22, 1956, 58 y.o.   MRN: [id number]  HPI Here for follow-up diverticulitis. She was seen about 10 days ago for left-sided pain. It was felt to be diverticulitis. She was started on antibiotics. Her abdominal pain is improved but she still feels very weak tired and nauseated. She has one more day of antibiotics but thinks they are making her feel bad.   On metronidazole and Cipro.  Taking metamucil daily.  Say  She usually is constipated and usuusaly takes laxatives 3 times per week. He does feel like the Zantac has been helping her reflux.   Review of Systems     Objective:   Physical Exam  Constitutional: [name] oriented to person, place, and time. She appears well-developed and well-nourished.  HENT:  Head: Normocephalic and atraumatic.  Cardiovascular: Normal rate.   Pulmonary/Chest: Breath sounds normal.  Abdominal: Soft. Bowel sounds are normal. She exhibits no distension and no mass. There is no tenderness. There is no rebound and no guarding.  Neurological: [name] alert and oriented to person, place, and time.  Skin: Skin is warm and dry.  Psychiatric: She has a normal mood and affect. Her behavior is normal.          Assessment & Plan:  Diverticulitis - she is sending significantly better today. Nontender on exam. Still little fatigued and nausea related. Encouraged her to complete her antibiotics. Call if her symptoms worsen or recur. If it happens again next time we will get a CT of the abdomen.  GERD- continue zantac. [name] getting better.  Her cough is much better.    Lesion on the right hepatic lobe. [name] concerned about this. I explained to her that the radiologist felt like it was most likely benign but we can certainly repeat the ultrasound 6 months just to confirm that there is no change in size etc.  Constipation - she says the fiber is really helping her bowels.  She was taking laxatives 3 times  a day.

## 2014-12-18 ENCOUNTER — Encounter: Payer: Self-pay | Admitting: Family

## 2014-12-18 ENCOUNTER — Ambulatory Visit (HOSPITAL_BASED_OUTPATIENT_CLINIC_OR_DEPARTMENT_OTHER): Payer: Medicare Other | Admitting: Family

## 2014-12-18 ENCOUNTER — Ambulatory Visit (HOSPITAL_BASED_OUTPATIENT_CLINIC_OR_DEPARTMENT_OTHER): Payer: Medicare Other | Admitting: Lab

## 2014-12-18 LAB — CBC WITH DIFFERENTIAL (CANCER CENTER ONLY)
BASO#: 0 10*3/uL (ref 0.0–0.2)
BASO%: 0.5 % (ref 0.0–2.0)
EOS ABS: 0.1 10*3/uL (ref 0.0–0.5)
EOS%: 1.7 % (ref 0.0–7.0)
HCT: 42.8 % (ref 34.8–46.6)
HEMOGLOBIN: 14 g/dL (ref 11.6–15.9)
LYMPH#: 1.6 10*3/uL (ref 0.9–3.3)
LYMPH%: 26.2 % (ref 14.0–48.0)
MCH: 31.6 pg (ref 26.0–34.0)
MCHC: 32.7 g/dL (ref 32.0–36.0)
MCV: 97 fL (ref 81–101)
MONO#: 0.7 10*3/uL (ref 0.1–0.9)
MONO%: 11.1 % (ref 0.0–13.0)
NEUT%: 60.5 % (ref 39.6–80.0)
NEUTROS ABS: 3.6 10*3/uL (ref 1.5–6.5)
PLATELETS: 392 10*3/uL (ref 145–400)
RBC: 4.43 10*6/uL (ref 3.70–5.32)
RDW: 12.7 % (ref 11.1–15.7)
WBC: 6 10*3/uL (ref 3.9–10.0)

## 2014-12-18 LAB — COMPREHENSIVE METABOLIC PANEL
ALK PHOS: 61 U/L (ref 39–117)
ALT: 11 U/L (ref 0–35)
AST: 16 U/L (ref 0–37)
Albumin: 3.8 g/dL (ref 3.5–5.2)
BILIRUBIN TOTAL: 0.4 mg/dL (ref 0.2–1.2)
BUN: 9 mg/dL (ref 6–23)
CO2: 26 mEq/L (ref 19–32)
Calcium: 8.8 mg/dL (ref 8.4–10.5)
Chloride: 104 mEq/L (ref 96–112)
Creatinine, Ser: 0.62 mg/dL (ref 0.50–1.10)
Glucose, Bld: 99 mg/dL (ref 70–99)
POTASSIUM: 4 meq/L (ref 3.5–5.3)
SODIUM: 139 meq/L (ref 135–145)
TOTAL PROTEIN: 6.5 g/dL (ref 6.0–8.3)

## 2014-12-18 LAB — IRON AND TIBC CHCC
%SAT: 21 % (ref 21–57)
Iron: 50 ug/dL (ref 41–142)
TIBC: 231 ug/dL — AB (ref 236–444)
UIBC: 182 ug/dL (ref 120–384)

## 2014-12-18 LAB — FERRITIN CHCC: FERRITIN: 78 ng/mL (ref 9–269)
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
## 2014-12-19 ENCOUNTER — Telehealth: Payer: Self-pay | Admitting: *Deleted

## 2014-12-19 NOTE — Telephone Encounter (Addendum)
Pt aware  ----- Message from Ann Napoleon, MD sent at 12/18/2014  6:54 PM EST ----- Call and let her know that the ferritin is still good. It is only 78. She does not need to be phlebotomized.

## 2015-01-12 ENCOUNTER — Telehealth: Payer: Self-pay

## 2015-01-12 NOTE — Telephone Encounter (Signed)
Patient called wanted to the the status of the approval on the orthoVisc. Patient stated that she updated her insurance card on 01/05. Namir Neto,CMA

## 2015-01-12 NOTE — Telephone Encounter (Signed)
Patient is scheduled   

## 2015-01-15 ENCOUNTER — Encounter: Payer: Self-pay | Admitting: Sports Medicine

## 2015-01-15 ENCOUNTER — Ambulatory Visit: Payer: Commercial Managed Care - HMO | Admitting: Sports Medicine

## 2015-01-15 ENCOUNTER — Ambulatory Visit (INDEPENDENT_AMBULATORY_CARE_PROVIDER_SITE_OTHER): Payer: Commercial Managed Care - HMO | Admitting: Sports Medicine

## 2015-01-15 ENCOUNTER — Ambulatory Visit (INDEPENDENT_AMBULATORY_CARE_PROVIDER_SITE_OTHER): Payer: Commercial Managed Care - HMO

## 2015-01-15 VITALS — BP 114/69 | HR 80 | Temp 99.0°F | Ht 61.0 in | Wt 134.0 lb

## 2015-01-15 DIAGNOSIS — J449 Chronic obstructive pulmonary disease, unspecified: Secondary | ICD-10-CM

## 2015-01-15 DIAGNOSIS — J029 Acute pharyngitis, unspecified: Secondary | ICD-10-CM | POA: Diagnosis not present

## 2015-01-15 DIAGNOSIS — R509 Fever, unspecified: Secondary | ICD-10-CM | POA: Diagnosis not present

## 2015-01-15 DIAGNOSIS — R05 Cough: Secondary | ICD-10-CM | POA: Diagnosis not present

## 2015-01-15 DIAGNOSIS — J441 Chronic obstructive pulmonary disease with (acute) exacerbation: Secondary | ICD-10-CM

## 2015-01-15 LAB — POCT RAPID STREP A (OFFICE): Rapid Strep A Screen: NEGATIVE

## 2015-01-15 MED ORDER — AZITHROMYCIN 250 MG PO TABS: ORAL_TABLET | ORAL | Status: DC

## 2015-01-15 NOTE — Progress Notes (Signed)
  Subjective:    CC: Follow-up  HPI: This is a pleasant 58 year old female, she has a history of COPD. For the past week she's had increasing cough productive of greenish below, fevers, chills, sore throat and sinus pressure, symptoms are moderate, persistent. No chest pain, no shortness of breath.  Past medical history, Surgical history, Family history not pertinant except as noted below, Social history, Allergies, and medications have been entered into the medical record, reviewed, and no changes needed.   Review of Systems: No fevers, chills, night sweats, weight loss, chest pain, or shortness of breath.   Objective:    General: Well Developed, well nourished, and in no acute distress.  Neuro: Alert and oriented x3, extra-ocular muscles intact, sensation grossly intact.  HEENT: Normocephalic, atraumatic, pupils equal round reactive to light, neck supple, no masses, no lymphadenopathy, thyroid nonpalpable.  Skin: Warm and dry, no rashes. Cardiac: Regular rate and rhythm, no murmurs rubs or gallops, no lower extremity edema.  Respiratory: Decreased sounds in the right base. Not using accessory muscles, speaking in full sentences.  Chest x-ray personally reviewed and is unremarkable with the exception of typical changes of COPD.  Impression and Recommendations:

## 2015-01-15 NOTE — Assessment & Plan Note (Signed)
Increasing cough productive of greenish pedal without hemoptysis. Associated with sore throat and a negative strep test today. Decreased breath sounds in the right lower lobe.  Azithromycin, over-the-counter cough and cold medications, chest x-ray.  Return if no better in one week.

## 2015-01-19 ENCOUNTER — Ambulatory Visit: Payer: Commercial Managed Care - HMO | Admitting: Sports Medicine

## 2015-01-22 DIAGNOSIS — H5203 Hypermetropia, bilateral: Secondary | ICD-10-CM | POA: Diagnosis not present

## 2015-01-22 DIAGNOSIS — H5210 Myopia, unspecified eye: Secondary | ICD-10-CM | POA: Diagnosis not present

## 2015-01-24 ENCOUNTER — Ambulatory Visit (INDEPENDENT_AMBULATORY_CARE_PROVIDER_SITE_OTHER): Payer: Commercial Managed Care - HMO | Admitting: Sports Medicine

## 2015-01-24 ENCOUNTER — Encounter: Payer: Self-pay | Admitting: Sports Medicine

## 2015-01-24 VITALS — BP 113/69 | HR 73 | Ht 61.0 in | Wt 131.0 lb

## 2015-01-24 DIAGNOSIS — M170 Bilateral primary osteoarthritis of knee: Secondary | ICD-10-CM | POA: Diagnosis not present

## 2015-01-24 NOTE — Assessment & Plan Note (Signed)
Orthovisc injection #1 of 4 into the left knee, she would like to try a TENS unit which I think is appropriate. Return in one week for injection #2 of 4.

## 2015-01-24 NOTE — Progress Notes (Signed)

## 2015-01-31 ENCOUNTER — Encounter: Payer: Self-pay | Admitting: Sports Medicine

## 2015-01-31 ENCOUNTER — Ambulatory Visit (INDEPENDENT_AMBULATORY_CARE_PROVIDER_SITE_OTHER): Payer: Commercial Managed Care - HMO

## 2015-01-31 ENCOUNTER — Ambulatory Visit (INDEPENDENT_AMBULATORY_CARE_PROVIDER_SITE_OTHER): Payer: Commercial Managed Care - HMO | Admitting: Sports Medicine

## 2015-01-31 VITALS — BP 109/72 | HR 78 | Wt 131.0 lb

## 2015-01-31 DIAGNOSIS — M170 Bilateral primary osteoarthritis of knee: Secondary | ICD-10-CM

## 2015-01-31 DIAGNOSIS — M25551 Pain in right hip: Secondary | ICD-10-CM

## 2015-01-31 DIAGNOSIS — M1611 Unilateral primary osteoarthritis, right hip: Secondary | ICD-10-CM | POA: Insufficient documentation

## 2015-01-31 DIAGNOSIS — R1030 Lower abdominal pain, unspecified: Secondary | ICD-10-CM

## 2015-01-31 DIAGNOSIS — R1031 Right lower quadrant pain: Secondary | ICD-10-CM

## 2015-01-31 DIAGNOSIS — M7621 Iliac crest spur, right hip: Secondary | ICD-10-CM | POA: Diagnosis not present
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
## 2015-01-31 NOTE — Assessment & Plan Note (Signed)
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 X-rays, return in one week, and we can see how things are going.

## 2015-01-31 NOTE — Assessment & Plan Note (Signed)
Orthovisc injection #2 into the left knee. Return in one week for #3.

## 2015-02-07 ENCOUNTER — Ambulatory Visit (INDEPENDENT_AMBULATORY_CARE_PROVIDER_SITE_OTHER): Payer: Commercial Managed Care - HMO | Admitting: Sports Medicine

## 2015-02-07 ENCOUNTER — Encounter: Payer: Self-pay | Admitting: Sports Medicine

## 2015-02-07 VITALS — BP 104/67 | HR 86 | Ht 61.0 in | Wt 131.0 lb

## 2015-02-07 DIAGNOSIS — M1611 Unilateral primary osteoarthritis, right hip: Secondary | ICD-10-CM

## 2015-02-07 DIAGNOSIS — M170 Bilateral primary osteoarthritis of knee: Secondary | ICD-10-CM

## 2015-02-07 NOTE — Assessment & Plan Note (Signed)
Orthovisc injection #3 of 4 into the left knee, starting to feel better. Return in one week for #4

## 2015-02-07 NOTE — Progress Notes (Signed)
  Subjective:    CC: follow-up  HPI: Left knee osteoarthritis: Feeling significantly better, here for Orthovisc injection #3 of 4.  Left hip pain: X-ray showed osteoarthritis, feeling significantly better than before.  Past medical history, Surgical history, Family history not pertinant except as noted below, Social history, Allergies, and medications have been entered into the medical record, reviewed, and no changes needed.   Review of Systems: No fevers, chills, night sweats, weight loss, chest pain, or shortness of breath.   Objective:    General: Well Developed, well nourished, and in no acute distress.  Neuro: Alert and oriented x3, extra-ocular muscles intact, sensation grossly intact.  HEENT: Normocephalic, atraumatic, pupils equal round reactive to light, neck supple, no masses, no lymphadenopathy, thyroid nonpalpable.  Skin: Warm and dry, no rashes. Cardiac: Regular rate and rhythm, no murmurs rubs or gallops, no lower extremity edema.  Respiratory: Clear to auscultation bilaterally. Not using accessory muscles, speaking in full sentences.  Procedure: Real-time Ultrasound Guided Injection of left knee Device: GE Logiq E  Verbal informed consent obtained.  Time-out conducted.  Noted no overlying erythema, induration, or other signs of local infection.  Skin prepped in a sterile fashion.  Local anesthesia: Topical Ethyl chloride.  With sterile technique and under real time ultrasound guidance:   30 mg/2 mL of OrthoVisc (sodium hyaluronate) in a prefilled syringe was injected easily into the knee through a 22-gauge needle. Completed without difficulty  Pain immediately resolved suggesting accurate placement of the medication.  Advised to call if fevers/chills, erythema, induration, drainage, or persistent bleeding.  Images permanently stored and available for review in the ultrasound unit.  Impression: Technically successful ultrasound guided injection.  Impression and  Recommendations:

## 2015-02-07 NOTE — Assessment & Plan Note (Signed)
X-rays did show right hip osteoarthritis, we avoided treatment at the last visit she returns today feeling significantly better, I would like to avoid any intra-articular treatment for as long as possible into her right hip joint.

## 2015-02-14 ENCOUNTER — Encounter: Payer: Self-pay | Admitting: Sports Medicine

## 2015-02-14 ENCOUNTER — Ambulatory Visit (INDEPENDENT_AMBULATORY_CARE_PROVIDER_SITE_OTHER): Payer: Commercial Managed Care - HMO | Admitting: Sports Medicine

## 2015-02-14 VITALS — BP 119/67 | HR 57 | Wt 132.0 lb

## 2015-02-14 DIAGNOSIS — M170 Bilateral primary osteoarthritis of knee: Secondary | ICD-10-CM

## 2015-02-14 NOTE — Assessment & Plan Note (Signed)
Orthovisc injection #4 of 4 into the left knee. Feeling significantly better with regards to her knee arthritis. Return in one month.

## 2015-02-14 NOTE — Progress Notes (Signed)
    Procedure: Real-time Ultrasound Guided Injection of left knee Device: GE Logiq E  Verbal informed consent obtained.  Time-out conducted.  Noted no overlying erythema, induration, or other signs of local infection.  Skin prepped in a sterile fashion.  Local anesthesia: Topical Ethyl chloride.  With sterile technique and under real time ultrasound guidance:   30 mg/2 mL of OrthoVisc (sodium hyaluronate) in a prefilled syringe was injected easily into the knee through a 22-gauge needle. Completed without difficulty  Pain immediately resolved suggesting accurate placement of the medication.  Advised to call if fevers/chills, erythema, induration, drainage, or persistent bleeding.  Images permanently stored and available for review in the ultrasound unit.  Impression: Technically successful ultrasound guided injection.  Impression and Recommendations:

## 2015-02-25 IMAGING — CR DG CHEST 2V
2 series · 2 of 2 positions shown · non-contrast
Comparison: 11/28/2014

CLINICAL DATA: Cough and fever for 2-3 days

EXAM:
CHEST  2 VIEW

[view not recorded (1 of 2)]
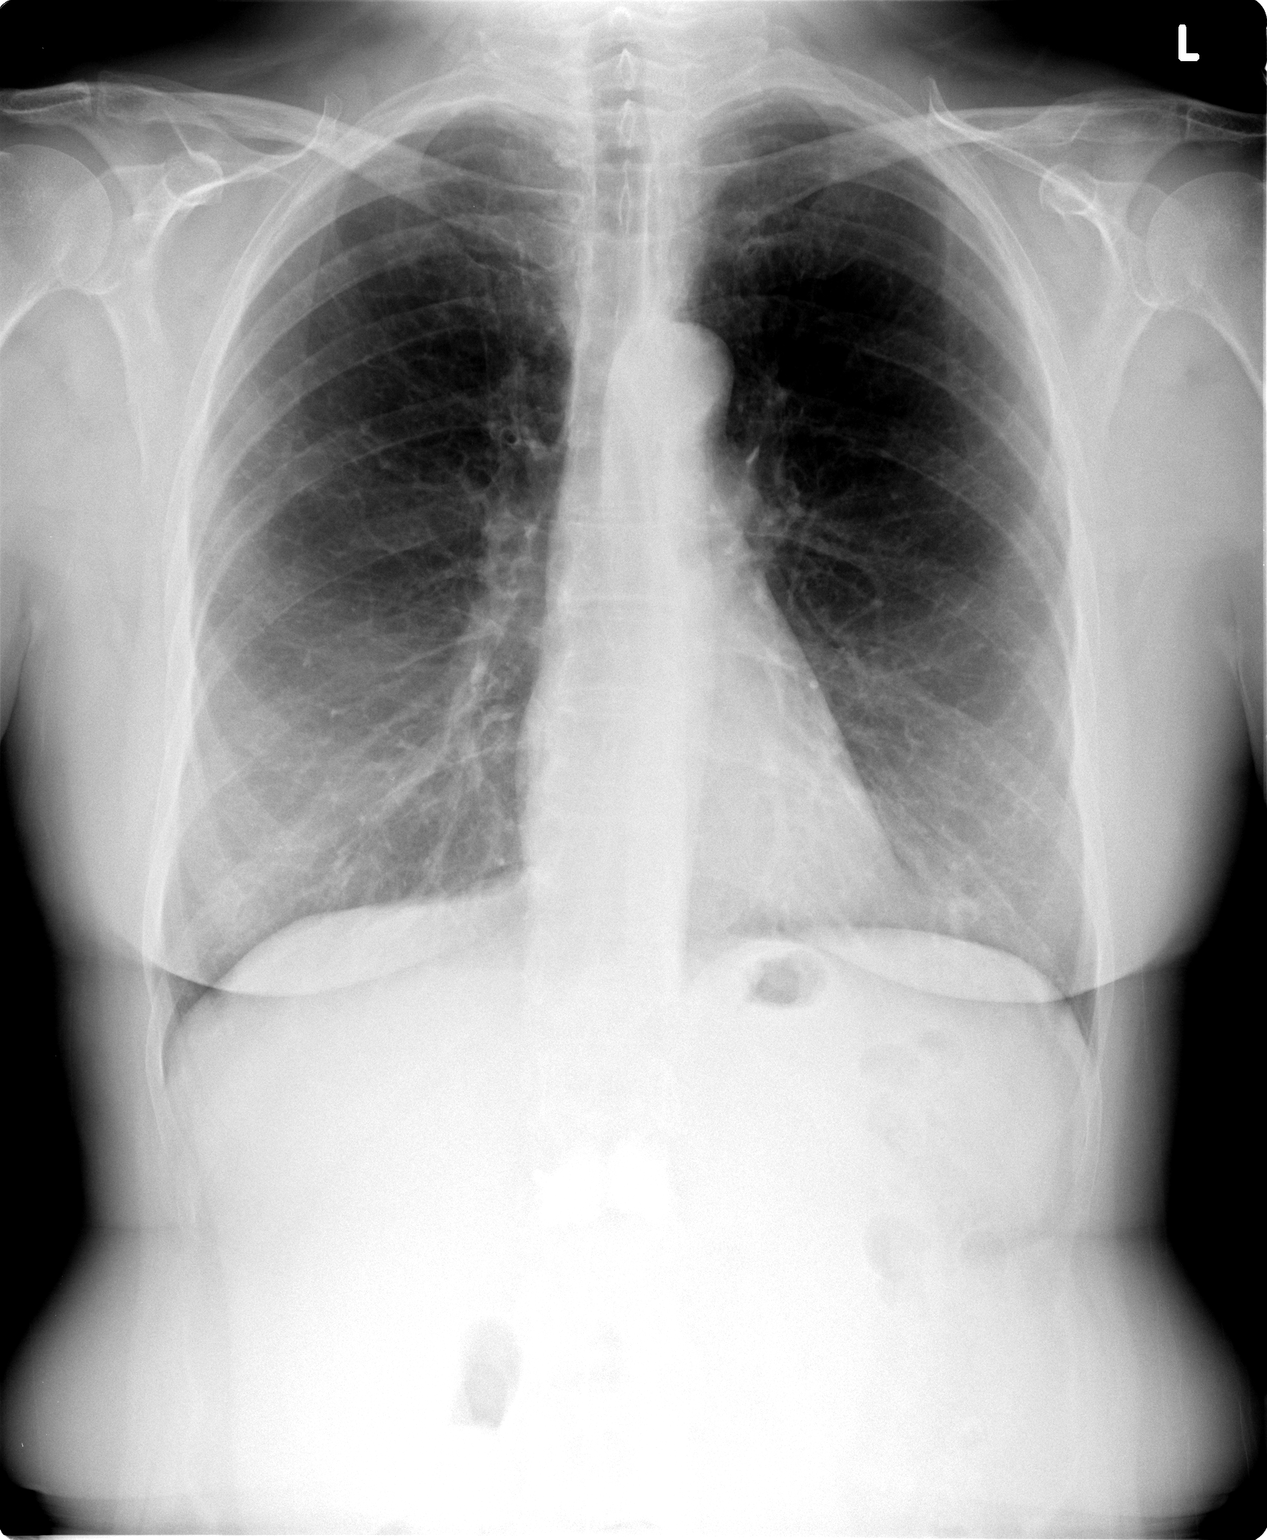

[view not recorded (2 of 2)]
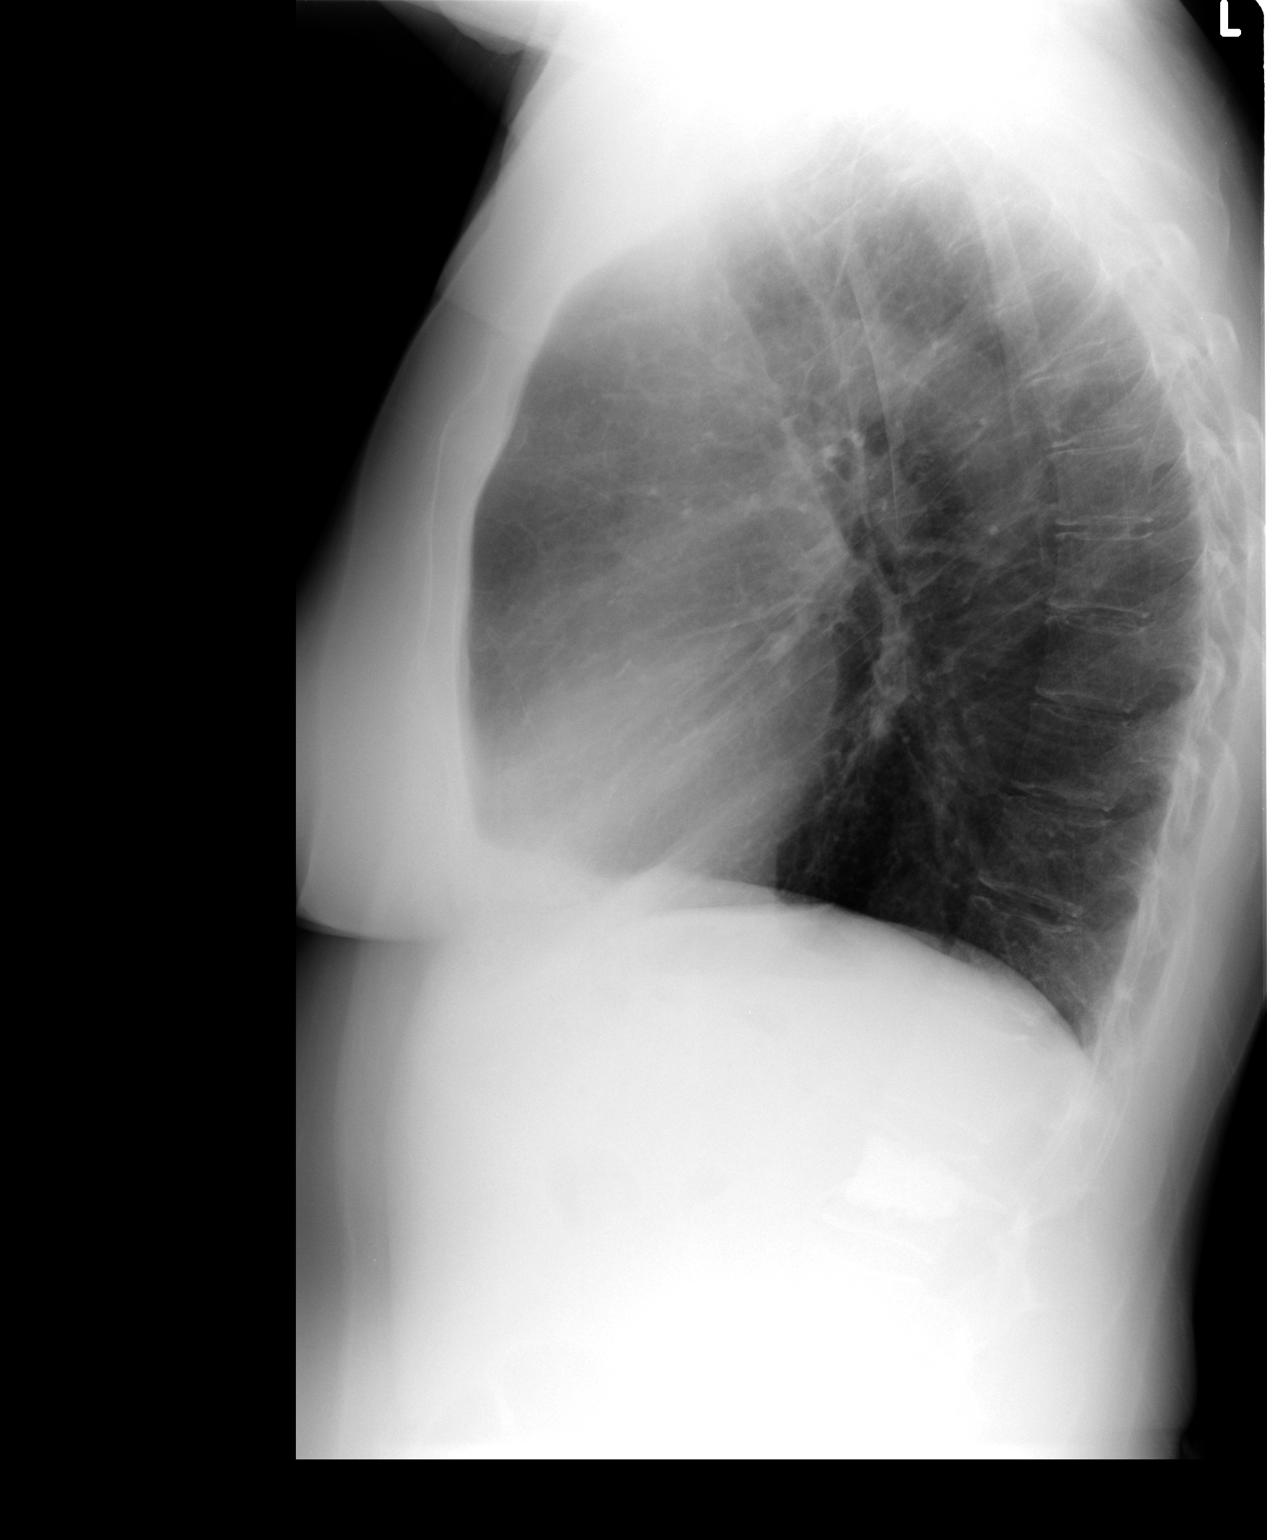

[2 of 2 positions shown; findings below may reference images not displayed]

FINDINGS: Cardiac shadow is within normal limits. The lungs are mildly
hyperaerated bilaterally. No focal infiltrate or sizable effusion is
seen. Changes of prior kyphoplasty are noted in the lumbar spine. No
other bony abnormality is seen.
IMPRESSION: COPD without acute abnormality.

## 2015-03-09 ENCOUNTER — Ambulatory Visit (INDEPENDENT_AMBULATORY_CARE_PROVIDER_SITE_OTHER): Payer: Commercial Managed Care - HMO | Admitting: Family Medicine

## 2015-03-09 ENCOUNTER — Encounter: Payer: Self-pay | Admitting: Family Medicine

## 2015-03-09 VITALS — BP 113/68 | HR 73 | Wt 131.0 lb

## 2015-03-09 DIAGNOSIS — E780 Pure hypercholesterolemia: Secondary | ICD-10-CM

## 2015-03-09 DIAGNOSIS — E039 Hypothyroidism, unspecified: Secondary | ICD-10-CM | POA: Diagnosis not present

## 2015-03-09 DIAGNOSIS — E789 Disorder of lipoprotein metabolism, unspecified: Secondary | ICD-10-CM

## 2015-03-09 DIAGNOSIS — K2100 Gastro-esophageal reflux disease with esophagitis, without bleeding: Secondary | ICD-10-CM

## 2015-03-09 DIAGNOSIS — E8881 Metabolic syndrome: Secondary | ICD-10-CM

## 2015-03-09 LAB — LIPID PANEL
CHOL/HDL RATIO: 2.6 ratio
Cholesterol: 176 mg/dL (ref 0–200)
HDL: 68 mg/dL (ref >=46)
LDL CALC: 92 mg/dL (ref 0–99)
Triglycerides: 82 mg/dL (ref <150)
VLDL: 16 mg/dL (ref 0–40)

## 2015-03-09 LAB — COMPLETE METABOLIC PANEL WITH GFR
ALBUMIN: 4.1 g/dL (ref 3.5–5.2)
ALT: 15 U/L (ref 0–35)
AST: 20 U/L (ref 0–37)
Alkaline Phosphatase: 75 U/L (ref 39–117)
BUN: 9 mg/dL (ref 6–23)
CALCIUM: 9.2 mg/dL (ref 8.4–10.5)
CHLORIDE: 103 meq/L (ref 96–112)
CO2: 26 meq/L (ref 19–32)
Creat: 0.69 mg/dL (ref 0.50–1.10)
GFR, Est African American: >89 mL/min
GLUCOSE: 100 mg/dL — AB (ref 70–99)
Potassium: 4.5 mEq/L (ref 3.5–5.3)
Sodium: 138 mEq/L (ref 135–145)
TOTAL PROTEIN: 6.6 g/dL (ref 6.0–8.3)
Total Bilirubin: 0.7 mg/dL (ref 0.2–1.2)

## 2015-03-09 LAB — POCT GLYCOSYLATED HEMOGLOBIN (HGB A1C): Hemoglobin A1C: 6.1

## 2015-03-09 LAB — TSH: TSH: 1.248 u[IU]/mL (ref 0.350–4.500)

## 2015-03-09 MED ORDER — LEVOTHYROXINE SODIUM 25 MCG PO TABS: ORAL_TABLET | ORAL | Status: DC

## 2015-03-09 MED ORDER — OMEPRAZOLE 40 MG PO CPDR: 40.0000 mg | DELAYED_RELEASE_CAPSULE | Freq: Every day | ORAL | Status: DC
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
## 2015-03-13 IMAGING — DX DG HIP (WITH OR WITHOUT PELVIS) 2-3V*R*
3 series · 3 of 3 positions shown · non-contrast
Comparison: None.

CLINICAL DATA: Right hip pain, no known injury

EXAM:
RIGHT HIP (WITH PELVIS) 2-3 VIEWS

[pelvis ap]
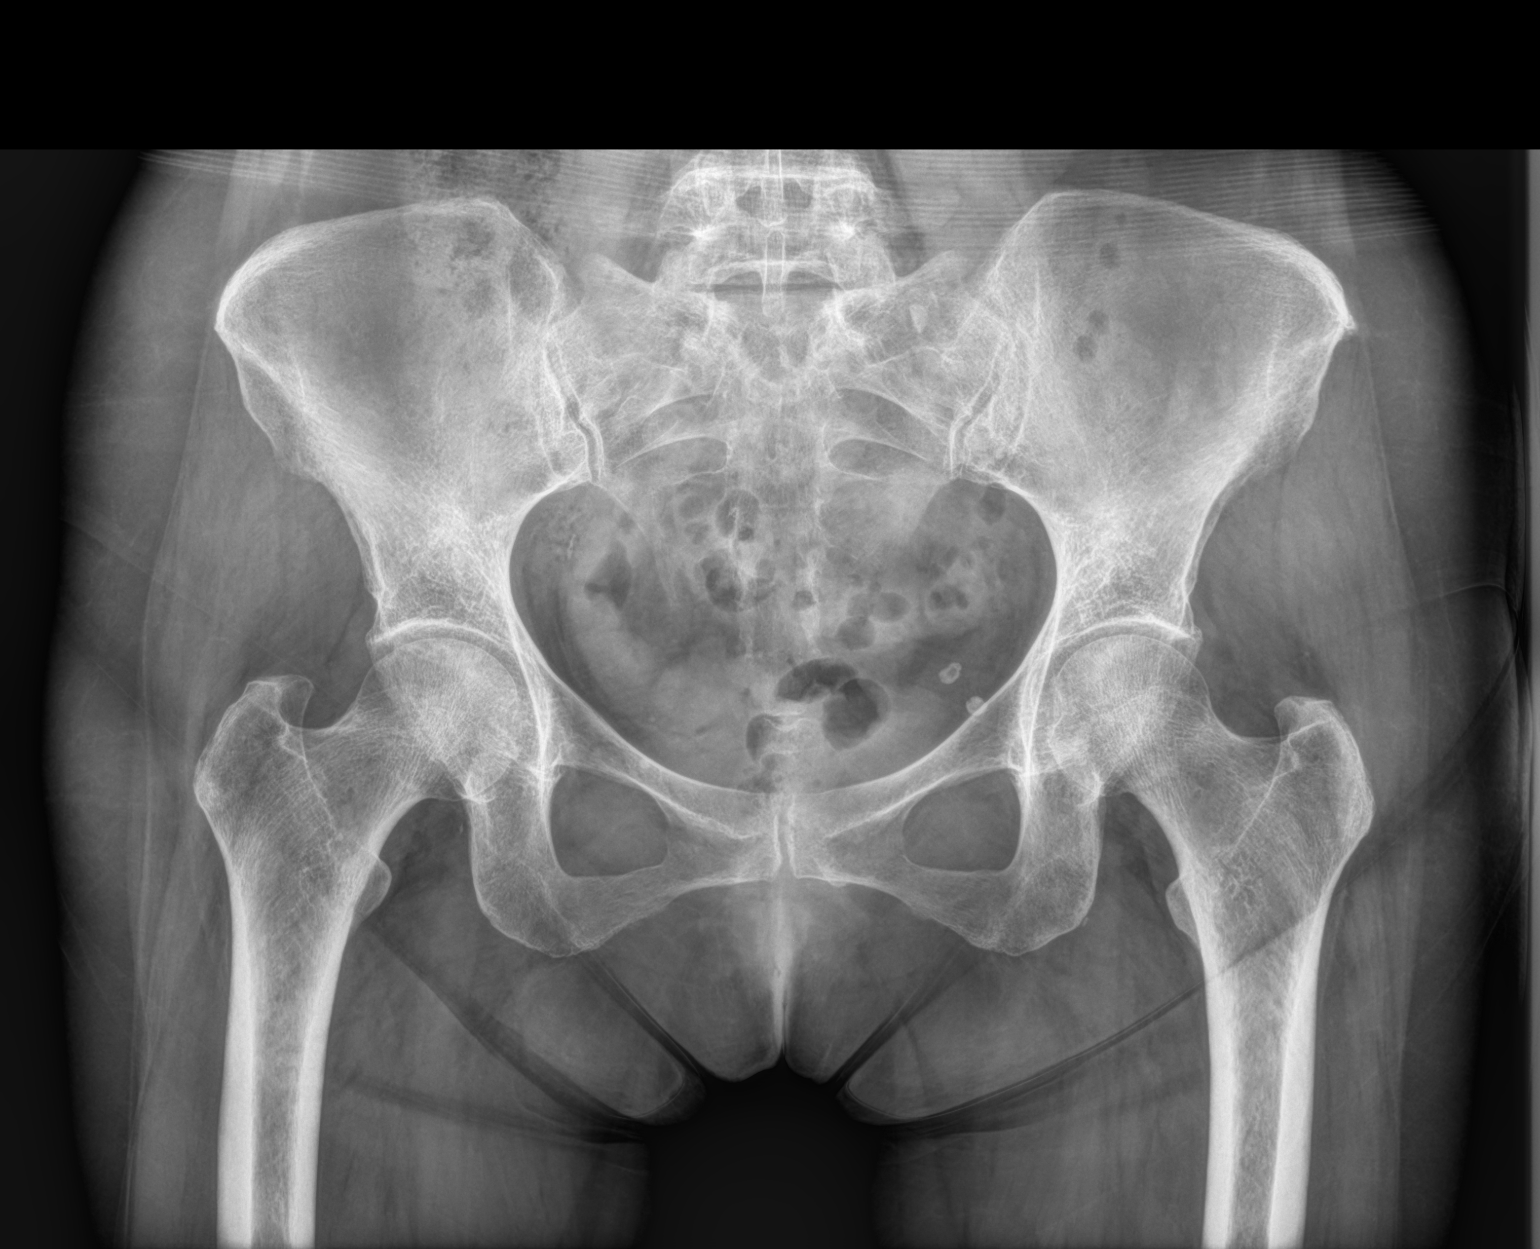

[hip ap]
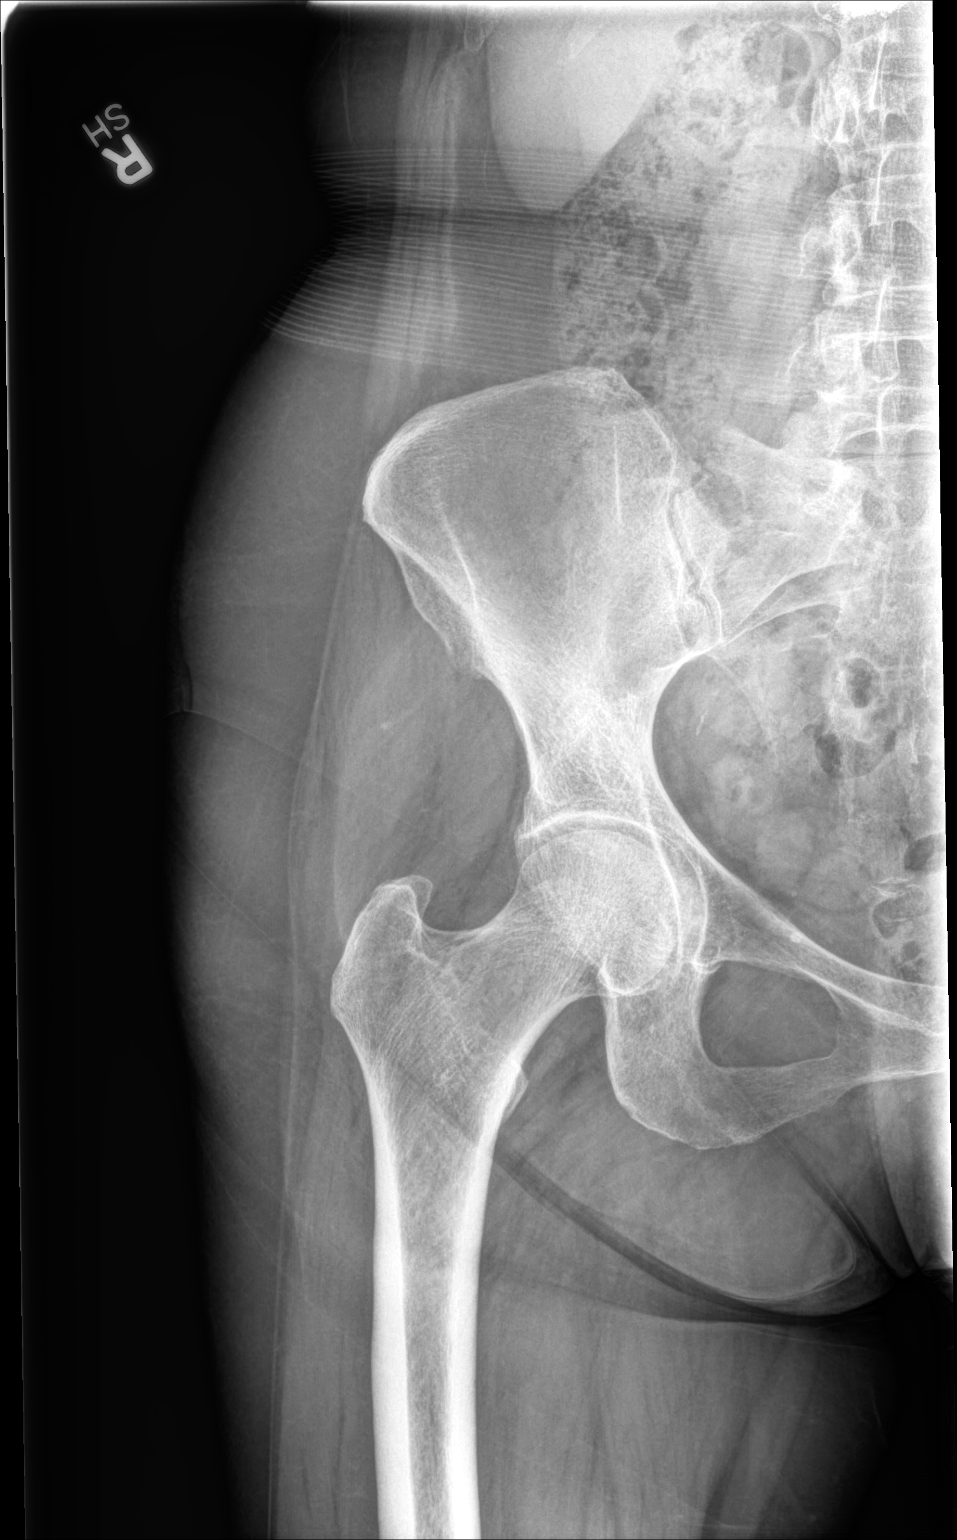

[hip lat]
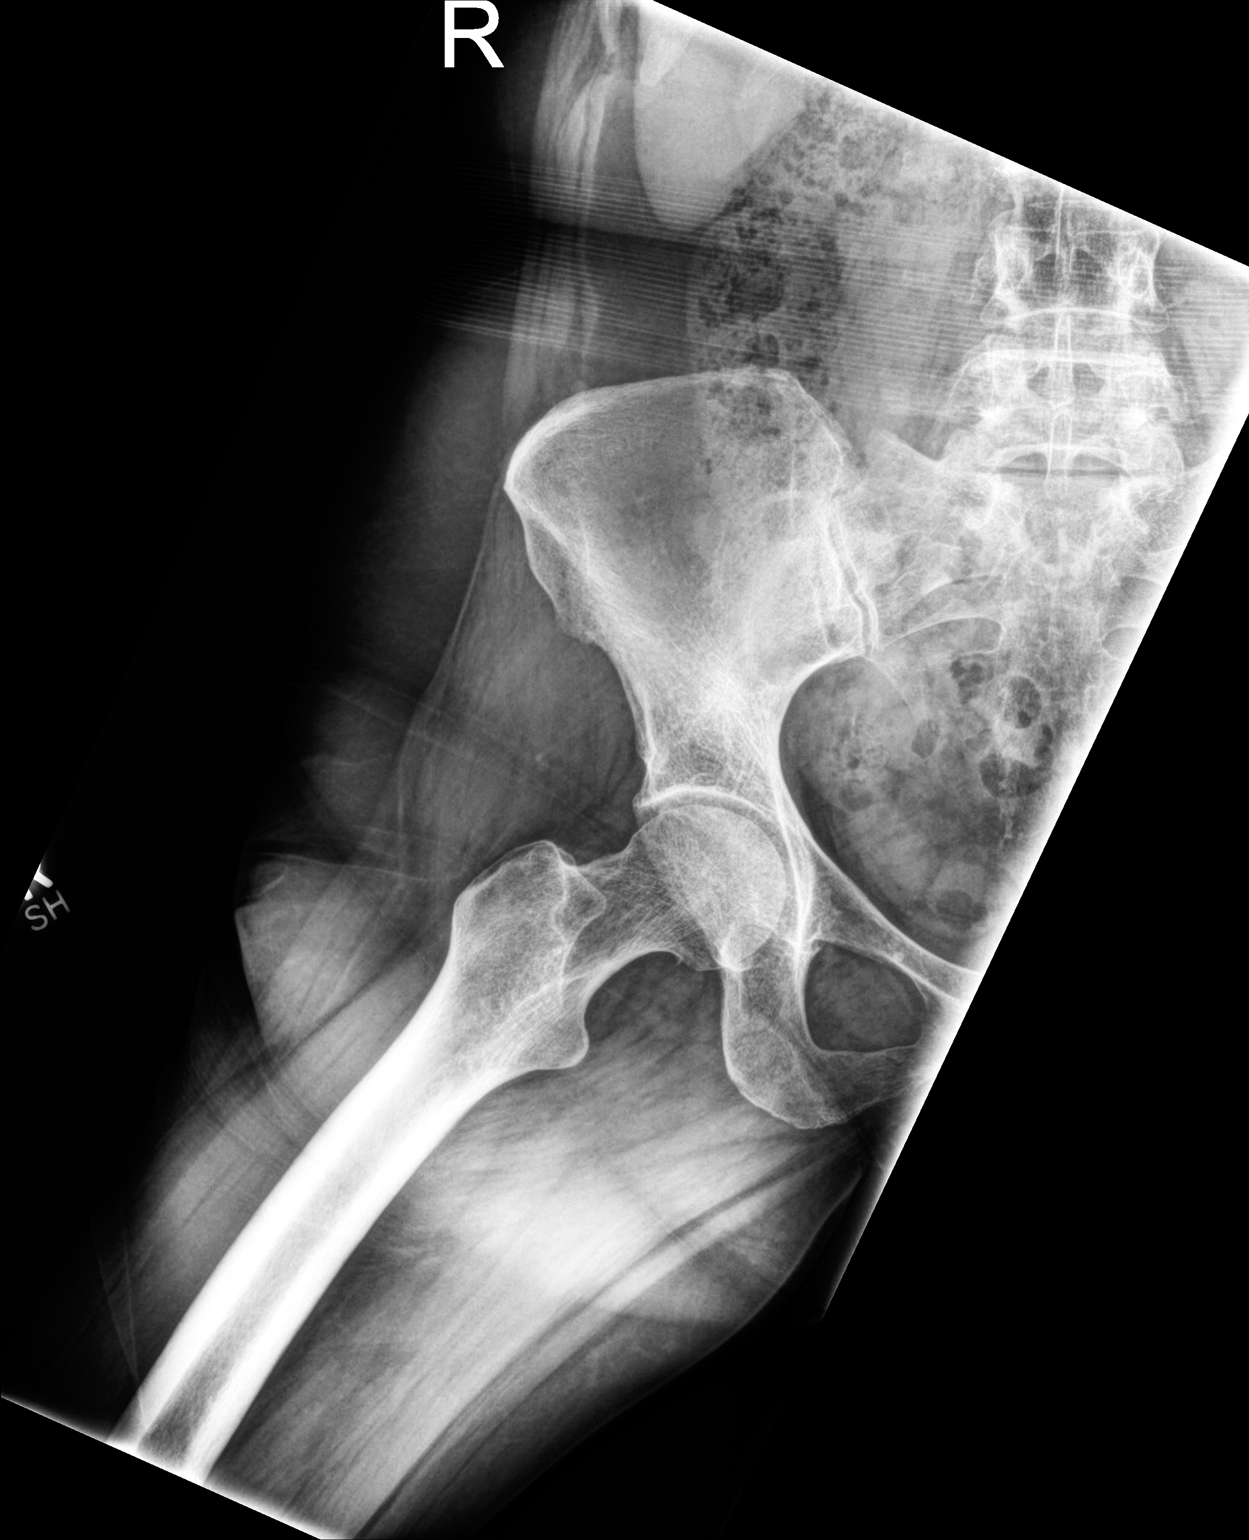

[3 of 3 positions shown; findings below may reference images not displayed]

FINDINGS: Three views of the right hip submitted. No acute fracture or
subluxation. Joint space is preserved. Minimal superior acetabular
spurring. Mild degenerative changes pubic symphysis. Bilateral hip
joints are symmetrical in appearance.
IMPRESSION: No acute fracture or subluxation.  Minimal degenerative changes.

## 2015-03-19 ENCOUNTER — Other Ambulatory Visit (HOSPITAL_BASED_OUTPATIENT_CLINIC_OR_DEPARTMENT_OTHER): Payer: Commercial Managed Care - HMO | Admitting: Lab

## 2015-03-19 ENCOUNTER — Ambulatory Visit (HOSPITAL_BASED_OUTPATIENT_CLINIC_OR_DEPARTMENT_OTHER): Payer: Commercial Managed Care - HMO | Admitting: Hematology & Oncology

## 2015-03-19 ENCOUNTER — Encounter: Payer: Self-pay | Admitting: Hematology & Oncology

## 2015-03-19 LAB — CBC WITH DIFFERENTIAL (CANCER CENTER ONLY)
BASO#: 0 10*3/uL (ref 0.0–0.2)
BASO%: 0.7 % (ref 0.0–2.0)
EOS%: 1.9 % (ref 0.0–7.0)
Eosinophils Absolute: 0.1 10*3/uL (ref 0.0–0.5)
HEMATOCRIT: 41.7 % (ref 34.8–46.6)
HEMOGLOBIN: 14 g/dL (ref 11.6–15.9)
LYMPH#: 2.1 10*3/uL (ref 0.9–3.3)
LYMPH%: 35 % (ref 14.0–48.0)
MCH: 31.3 pg (ref 26.0–34.0)
MCHC: 33.6 g/dL (ref 32.0–36.0)
MCV: 93 fL (ref 81–101)
MONO#: 0.7 10*3/uL (ref 0.1–0.9)
MONO%: 12 % (ref 0.0–13.0)
NEUT#: 3 10*3/uL (ref 1.5–6.5)
NEUT%: 50.4 % (ref 39.6–80.0)
Platelets: 291 10*3/uL (ref 145–400)
RBC: 4.47 10*6/uL (ref 3.70–5.32)
RDW: 13.5 % (ref 11.1–15.7)
WBC: 5.9 10*3/uL (ref 3.9–10.0)

## 2015-03-19 LAB — CMP (CANCER CENTER ONLY)
ALK PHOS: 80 U/L (ref 26–84)
ALT(SGPT): 18 U/L (ref 10–47)
AST: 21 U/L (ref 11–38)
Albumin: 3.5 g/dL (ref 3.3–5.5)
BILIRUBIN TOTAL: 0.6 mg/dL (ref 0.20–1.60)
BUN: 12 mg/dL (ref 7–22)
CO2: 29 mEq/L (ref 18–33)
CREATININE: 0.5 mg/dL — AB (ref 0.6–1.2)
Calcium: 9.4 mg/dL (ref 8.0–10.3)
Chloride: 104 mEq/L (ref 98–108)
GLUCOSE: 93 mg/dL (ref 73–118)
POTASSIUM: 4.4 meq/L (ref 3.3–4.7)
Sodium: 144 mEq/L (ref 128–145)
Total Protein: 7.3 g/dL (ref 6.4–8.1)

## 2015-03-19 LAB — IRON AND TIBC CHCC
%SAT: 31 % (ref 21–57)
IRON: 83 ug/dL (ref 41–142)
TIBC: 266 ug/dL (ref 236–444)
UIBC: 183 ug/dL (ref 120–384)

## 2015-03-19 LAB — FERRITIN CHCC: FERRITIN: 57 ng/mL (ref 9–269)

## 2015-03-19 NOTE — Progress Notes (Signed)
Hematology and Oncology Follow Up Visit  Ann Guerrero [id number] [birth year] 59 y.o. 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  Ann Guerrero is back for followup. Back in December. Her last phlebotomy was back in September.  She's got her a hard time right now. There is a lot of family issues. [name] praying quite a lot.  She's had no problems with cough or shortness of breath. She's had no change in bowel or bladder habits. She's had no rashes. She's had no bony pain. She's had no headache.   Last saw her in December, her ferritin was 78  Overall, her performance status is ECOG 1.  Medications:  Current outpatient prescriptions:  .  alprazolam (XANAX) 2 MG tablet, Take 1 tablet (2 mg total) by mouth at bedtime as needed., Disp: 90 tablet, Rfl: 0 .  Calcium Carbonate-Vit D-Min (CALCIUM 1200 PO), Take by mouth 2 (two) times a week., Disp: , Rfl:  .  Cholecalciferol (VITAMIN D3) 1000 UNITS CAPS, Take by mouth 2 (two) times a week. , Disp: , Rfl:  .  IODINE, KELP, PO, Take 1 drop by mouth., Disp: , Rfl:  .  levothyroxine (SYNTHROID, LEVOTHROID) 25 MCG tablet, TAKE ONE TABLET BY MOUTH ONCE DAILY, Disp: 90 tablet, Rfl: 3 .  omeprazole (PRILOSEC) 40 MG capsule, Take 1 capsule (40 mg total) by mouth daily., Disp: 90 capsule, Rfl: 3 .  psyllium (METAMUCIL) 58.6 % powder, Take by mouth daily. 1 tablespoon Daily, Disp: , Rfl:   Allergies:  Allergies  Allergen Reactions  . Codeine Nausea And Vomiting  . Celebrex [Celecoxib] Other (See Comments)    Severe HA  . Erythromycin     REACTION: Hives  . Milnacipran Hcl Other (See Comments)    Nausea or fever    Past Medical History, Surgical history, Social history, and Family History were reviewed and updated.  Review of Systems: As above  Physical Exam:  height is 5\' 1"  (1.549 m) and weight is 133 lb  (60.328 kg). Her oral temperature is 97.7 F (36.5 C). Her blood pressure is 124/68 and her pulse is 55. Her respiration is 14.   Well-developed and well-nourished white female. Head and neck exam shows no ocular or oral lesions. There are no palpable cervical or supraclavicular lymph nodes. Lungs are clear. Cardiac exam regular rate and rhythm with no murmurs, rubs or bruits. Abdomen is soft. She has good bowel sounds. There is no fluid wave. There is no palpable liver or spleen tip. Back exam shows no tenderness over the spine, ribs or hips. Extremities shows no clubbing, cyanosis or edema. Neurological exam shows no focal neurological deficits. Skin exam shows no rashes, ecchymoses or petechia.  Lab Results  Component Value Date   WBC 5.9 03/19/2015   HGB 14.0 03/19/2015   HCT 41.7 03/19/2015   MCV 93 03/19/2015   PLT 291 03/19/2015     Chemistry      Component Value Date/Time   NA 144 03/19/2015 0852   NA 138 03/09/2015 0919   NA 139 11/28/2011   K 4.4 03/19/2015 0852   K 4.5 03/09/2015 0919   CL 104 03/19/2015 0852   CL 103 03/09/2015 0919   CO2 29 03/19/2015 0852   CO2 26 03/09/2015 0919   BUN 12 03/19/2015 0852   BUN 9 03/09/2015 0919   BUN  11 11/28/2011   CREATININE 0.5* 03/19/2015 0852   CREATININE 0.62 12/18/2014 0859   CREATININE 0.7 11/28/2011   GLU 84 11/28/2011      Component Value Date/Time   CALCIUM 9.4 03/19/2015 0852   CALCIUM 9.2 03/09/2015 0919   ALKPHOS 80 03/19/2015 0852   ALKPHOS 75 03/09/2015 0919   AST 21 03/19/2015 0852   AST 20 03/09/2015 0919   ALT 18 03/19/2015 0852   ALT 15 03/09/2015 0919   BILITOT 0.60 03/19/2015 0852   BILITOT 0.7 03/09/2015 0919      Ferritin is 57. Iron saturation is 31%.   Impression and Plan: [name] is 59 year old female. She has hemachromatosis. She was last phlebotomized in September. Her iron levels look great. She does not need to be phlebotomized.  I feel bad that she's having problems with her family.  She has a strong faith. I know that she will get through all this. Otherwise, I do not see any other issues.  We will plan to get her back to see [place] in about 3 months Volanda Napoleon, MD 3/21/20163:09 PM

## 2015-05-02 ENCOUNTER — Other Ambulatory Visit: Payer: Self-pay | Admitting: Family Medicine

## 2015-05-03 ENCOUNTER — Other Ambulatory Visit: Payer: Self-pay | Admitting: Family Medicine

## 2015-05-22 ENCOUNTER — Encounter: Payer: Self-pay | Admitting: Sports Medicine

## 2015-05-22 ENCOUNTER — Ambulatory Visit (INDEPENDENT_AMBULATORY_CARE_PROVIDER_SITE_OTHER): Payer: Commercial Managed Care - HMO | Admitting: Sports Medicine

## 2015-05-22 VITALS — BP 120/71 | HR 62 | Wt 134.0 lb

## 2015-05-22 DIAGNOSIS — M722 Plantar fascial fibromatosis: Secondary | ICD-10-CM

## 2015-05-22 NOTE — Progress Notes (Signed)
  Subjective:    CC: Left foot pain  HPI: Tanisa returns, she has left heel pain, her last plantar fascia injection was 8 months ago, she is doing well in her custom orthotics, pain is severe, persistent without radiation. She desires interventional treatment today.  Past medical history, Surgical history, Family history not pertinant except as noted below, Social history, Allergies, and medications have been entered into the medical record, reviewed, and no changes needed.   Review of Systems: No fevers, chills, night sweats, weight loss, chest pain, or shortness of breath.   Objective:    General: Well Developed, well nourished, and in no acute distress.  Neuro: Alert and oriented x3, extra-ocular muscles intact, sensation grossly intact.  HEENT: Normocephalic, atraumatic, pupils equal round reactive to light, neck supple, no masses, no lymphadenopathy, thyroid nonpalpable.  Skin: Warm and dry, no rashes. Cardiac: Regular rate and rhythm, no murmurs rubs or gallops, no lower extremity edema.  Respiratory: Clear to auscultation bilaterally. Not using accessory muscles, speaking in full sentences. Left Foot: No visible erythema or swelling. Range of motion is full in all directions. Strength is 5/5 in all directions. No hallux valgus. No pes cavus or pes planus. No abnormal callus noted. No pain over the navicular prominence, or base of fifth metatarsal. Tender to palpation of the calcaneal insertion of plantar fascia. No pain at the Achilles insertion. No pain over the calcaneal bursa. No pain of the retrocalcaneal bursa. No tenderness to palpation over the tarsals, metatarsals, or phalanges. No hallux rigidus or limitus. No tenderness palpation over interphalangeal joints. No pain with compression of the metatarsal heads. Neurovascularly intact distally.  Procedure: Real-time Ultrasound Guided Injection of left plantar fascial origin Device: GE Logiq E  Verbal informed  consent obtained.  Time-out conducted.  Noted no overlying erythema, induration, or other signs of local infection.  Skin prepped in a sterile fashion.  Local anesthesia: Topical Ethyl chloride.  With sterile technique and under real time ultrasound guidance:  25-gauge needle advanced through a thickened plantar fascia measuring 0.75 cm in thickness, I then injected 1 mL kenalog 40, 2 mL lidocaine just deep to the insertion into the calcaneus. Completed without difficulty  Pain immediately resolved suggesting accurate placement of the medication.  Advised to call if fevers/chills, erythema, induration, drainage, or persistent bleeding.  Images permanently stored and available for review in the ultrasound unit.  Impression: Technically successful ultrasound guided injection.  Impression and Recommendations:

## 2015-05-22 NOTE — Assessment & Plan Note (Signed)
Repeat injection as above, previous injection was 8 months ago. Return in one month with response, continue custom orthotics.

## 2015-06-18 ENCOUNTER — Other Ambulatory Visit: Payer: Commercial Managed Care - HMO

## 2015-06-18 ENCOUNTER — Ambulatory Visit: Payer: Commercial Managed Care - HMO | Admitting: Family

## 2015-06-19 ENCOUNTER — Ambulatory Visit: Payer: Commercial Managed Care - HMO | Admitting: Sports Medicine

## 2015-09-11 ENCOUNTER — Other Ambulatory Visit: Payer: Self-pay | Admitting: Sports Medicine

## 2015-09-25 ENCOUNTER — Ambulatory Visit (INDEPENDENT_AMBULATORY_CARE_PROVIDER_SITE_OTHER): Payer: Commercial Managed Care - HMO | Admitting: Family Medicine

## 2015-09-25 ENCOUNTER — Encounter: Payer: Self-pay | Admitting: Family Medicine

## 2015-09-25 VITALS — BP 111/73 | HR 71 | Temp 98.2°F | Wt 134.0 lb

## 2015-09-25 DIAGNOSIS — R109 Unspecified abdominal pain: Secondary | ICD-10-CM | POA: Diagnosis not present

## 2015-09-25 DIAGNOSIS — R110 Nausea: Secondary | ICD-10-CM

## 2015-09-25 LAB — COMPLETE METABOLIC PANEL WITH GFR
ALBUMIN: 4.3 g/dL (ref 3.6–5.1)
ALK PHOS: 84 U/L (ref 33–130)
ALT: 16 U/L (ref 6–29)
AST: 21 U/L (ref 10–35)
BILIRUBIN TOTAL: 0.5 mg/dL (ref 0.2–1.2)
BUN: 8 mg/dL (ref 7–25)
CALCIUM: 9.3 mg/dL (ref 8.6–10.4)
CHLORIDE: 103 mmol/L (ref 98–110)
CO2: 25 mmol/L (ref 20–31)
Creat: 0.6 mg/dL (ref 0.50–0.99)
GFR, Est Non African American: >89 mL/min (ref >=60)
Glucose, Bld: 103 mg/dL — ABNORMAL HIGH (ref 65–99)
Potassium: 4.5 mmol/L (ref 3.5–5.3)
Sodium: 137 mmol/L (ref 135–146)
TOTAL PROTEIN: 7 g/dL (ref 6.1–8.1)

## 2015-09-25 LAB — CBC WITH DIFFERENTIAL/PLATELET
BASOS ABS: 0.1 10*3/uL (ref 0.0–0.1)
Basophils Relative: 1 % (ref 0–1)
EOS PCT: 2 % (ref 0–5)
Eosinophils Absolute: 0.2 10*3/uL (ref 0.0–0.7)
HEMATOCRIT: 42.9 % (ref 36.0–46.0)
HEMOGLOBIN: 15 g/dL (ref 12.0–15.0)
LYMPHS ABS: 2 10*3/uL (ref 0.7–4.0)
LYMPHS PCT: 25 % (ref 12–46)
MCH: 31.6 pg (ref 26.0–34.0)
MCHC: 35 g/dL (ref 30.0–36.0)
MCV: 90.5 fL (ref 78.0–100.0)
MPV: 9.2 fL (ref 8.6–12.4)
Monocytes Absolute: 0.7 10*3/uL (ref 0.1–1.0)
Monocytes Relative: 9 % (ref 3–12)
NEUTROS ABS: 5.1 10*3/uL (ref 1.7–7.7)
Neutrophils Relative %: 63 % (ref 43–77)
Platelets: 318 10*3/uL (ref 150–400)
RBC: 4.74 MIL/uL (ref 3.87–5.11)
RDW: 13 % (ref 11.5–15.5)
WBC: 8.1 10*3/uL (ref 4.0–10.5)
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
## 2015-09-26 ENCOUNTER — Ambulatory Visit (HOSPITAL_BASED_OUTPATIENT_CLINIC_OR_DEPARTMENT_OTHER)
Admission: RE | Admit: 2015-09-26 | Discharge: 2015-09-26 | Disposition: A | Discharge status: Home / Self Care | Payer: Commercial Managed Care - HMO | Source: Ambulatory Visit | Attending: Family Medicine | Admitting: Family Medicine

## 2015-09-26 ENCOUNTER — Other Ambulatory Visit: Payer: Self-pay | Admitting: Family Medicine

## 2015-09-26 ENCOUNTER — Encounter: Payer: Self-pay | Admitting: Family Medicine

## 2015-09-26 DIAGNOSIS — K7689 Other specified diseases of liver: Secondary | ICD-10-CM | POA: Diagnosis not present

## 2015-09-26 DIAGNOSIS — I700 Atherosclerosis of aorta: Secondary | ICD-10-CM | POA: Insufficient documentation

## 2015-09-26 DIAGNOSIS — K769 Liver disease, unspecified: Secondary | ICD-10-CM | POA: Diagnosis not present

## 2015-09-26 DIAGNOSIS — K5730 Diverticulosis of large intestine without perforation or abscess without bleeding: Secondary | ICD-10-CM | POA: Insufficient documentation

## 2015-09-26 DIAGNOSIS — R109 Unspecified abdominal pain: Secondary | ICD-10-CM

## 2015-09-26 DIAGNOSIS — R1032 Left lower quadrant pain: Secondary | ICD-10-CM | POA: Insufficient documentation

## 2015-09-26 MED ORDER — IOHEXOL 300 MG/ML  SOLN
100.0000 mL | Freq: Once | INTRAMUSCULAR | Status: AC | PRN
  Administered 2015-09-26: 100 mL via INTRAVENOUS

## 2015-09-26 MED ORDER — CIPROFLOXACIN HCL 500 MG PO TABS: 500.0000 mg | ORAL_TABLET | Freq: Two times a day (BID) | ORAL | Status: DC

## 2015-09-26 MED ORDER — METRONIDAZOLE 500 MG PO TABS: 500.0000 mg | ORAL_TABLET | Freq: Two times a day (BID) | ORAL | Status: DC

## 2015-09-27 ENCOUNTER — Other Ambulatory Visit: Payer: Self-pay | Admitting: Family Medicine

## 2015-09-27 MED ORDER — SULFAMETHOXAZOLE-TRIMETHOPRIM 800-160 MG PO TABS: 1.0000 | ORAL_TABLET | Freq: Two times a day (BID) | ORAL | Status: DC

## 2015-10-01 ENCOUNTER — Ambulatory Visit: Payer: Commercial Managed Care - HMO | Admitting: Family Medicine

## 2016-01-16 ENCOUNTER — Other Ambulatory Visit: Payer: Self-pay | Admitting: Family Medicine

## 2016-01-16 ENCOUNTER — Other Ambulatory Visit: Payer: Self-pay | Admitting: Sports Medicine

## 2016-02-07 ENCOUNTER — Other Ambulatory Visit: Payer: Self-pay | Admitting: Sports Medicine

## 2016-02-08 ENCOUNTER — Other Ambulatory Visit: Payer: Self-pay | Admitting: Sports Medicine

## 2016-02-25 ENCOUNTER — Encounter: Payer: Self-pay | Admitting: Family Medicine

## 2016-02-25 ENCOUNTER — Other Ambulatory Visit: Payer: Self-pay | Admitting: Family Medicine

## 2016-02-25 ENCOUNTER — Ambulatory Visit (INDEPENDENT_AMBULATORY_CARE_PROVIDER_SITE_OTHER): Payer: Commercial Managed Care - HMO | Admitting: Family Medicine

## 2016-02-25 VITALS — BP 140/83 | HR 65 | Ht 61.0 in | Wt 129.0 lb

## 2016-02-25 DIAGNOSIS — Z1231 Encounter for screening mammogram for malignant neoplasm of breast: Secondary | ICD-10-CM

## 2016-02-25 DIAGNOSIS — Z1159 Encounter for screening for other viral diseases: Secondary | ICD-10-CM

## 2016-02-25 DIAGNOSIS — Z0000 Encounter for general adult medical examination without abnormal findings: Secondary | ICD-10-CM

## 2016-02-25 DIAGNOSIS — Z13220 Encounter for screening for lipoid disorders: Secondary | ICD-10-CM | POA: Diagnosis not present

## 2016-02-25 DIAGNOSIS — E038 Other specified hypothyroidism: Secondary | ICD-10-CM | POA: Diagnosis not present

## 2016-02-25 DIAGNOSIS — E8881 Metabolic syndrome: Secondary | ICD-10-CM

## 2016-02-25 DIAGNOSIS — I700 Atherosclerosis of aorta: Secondary | ICD-10-CM | POA: Diagnosis not present

## 2016-02-25 DIAGNOSIS — E88819 Insulin resistance, unspecified: Secondary | ICD-10-CM

## 2016-02-25 DIAGNOSIS — R7301 Impaired fasting glucose: Secondary | ICD-10-CM | POA: Diagnosis not present

## 2016-02-25 MED ORDER — ALPRAZOLAM 2 MG PO TABS: 2.0000 mg | ORAL_TABLET | Freq: Every evening | ORAL | Status: DC | PRN

## 2016-02-25 NOTE — Addendum Note (Signed)
Addended by: Beatrice Lecher D on: 02/25/2016 12:29 PM   Modules accepted: Orders

## 2016-02-25 NOTE — Progress Notes (Signed)
Subjective:    Ann Guerrero is a 60 y.o. female who presents for Medicare Annual/Subsequent preventive examination.  Preventive Screening-Counseling & Management  Tobacco History  Smoking status  . Former Smoker -- 0.50 packs/day for 40 years  . Types: Cigarettes  . Start date: 03/27/1969  . Quit date: 08/29/2012  Smokeless tobacco  . Never Used    Comment: quit smoking 2 years ago     Problems Prior to Visit 1. She has been under a little more stress recently helping to raise her granddaughter.  Current Problems (verified) Patient Active Problem List   Diagnosis Date Noted  . Aortic atherosclerosis (HCC) 09/26/2015  . Osteoarthritis of right hip 01/31/2015  . Hepatic lesion 12/08/2014  . Slow transit constipation 12/08/2014  . Hepatic cyst 11/28/2014  . Hepatic hemangioma 11/28/2014  . Plantar fasciitis, left 09/05/2014  . Osteoarthritis of both knees 07/11/2014  . Benign microscopic hematuria 04/07/2014  . Anxiety state, unspecified 05/31/2013  . Closed fracture of right ninth rib 05/17/2013  . Hemochromatosis 12/10/2012  . Kidney stones 10/12/2012  . Osteoporosis 10/12/2012  . Vitamin D deficiency 05/18/2011  . ARM PAIN 02/21/2010  . POLYARTHRITIS 02/14/2010  . BACK PAIN, LUMBAR 11/27/2008  . NUMBNESS 09/27/2008  . LATERAL EPICONDYLITIS, RIGHT 06/15/2008  . INSOMNIA 05/25/2008  . GOITER, MULTINODULAR 05/15/2008  . SMOKER 05/15/2008  . Insulin resistance 02/02/2008  . SHOULDER PAIN 11/29/2007  . OTHER AND UNSPECIFIED ANGINA PECTORIS 11/12/2007  . COPD exacerbation (HCC) 11/12/2007  . MIGRAINE WITH AURA 11/09/2007  . Hypothyroidism 11/01/2007    Medications Prior to Visit Current Outpatient Prescriptions on File Prior to Visit  Medication Sig Dispense Refill  . alprazolam (XANAX) 2 MG tablet TAKE 1 TABLET AT BEDTIME AS NEEDED 90 tablet 0  . Calcium Carbonate-Vit D-Min (CALCIUM 1200 PO) Take by mouth 2 (two) times a week.    . Cholecalciferol (VITAMIN  D3) 1000 UNITS CAPS Take by mouth 2 (two) times a week.     . IODINE, KELP, PO Take 1 drop by mouth.    . levothyroxine (SYNTHROID, LEVOTHROID) 25 MCG tablet Take 1 tablet (25 mcg total) by mouth daily before breakfast. Patient needs to schedule a follow up appointment before more refills. 90 tablet 0  . psyllium (METAMUCIL) 58.6 % powder Take by mouth daily. 1 tablespoon Daily     No current facility-administered medications on file prior to visit.    Current Medications (verified) Current Outpatient Prescriptions  Medication Sig Dispense Refill  . alprazolam (XANAX) 2 MG tablet TAKE 1 TABLET AT BEDTIME AS NEEDED 90 tablet 0  . Calcium Carbonate-Vit D-Min (CALCIUM 1200 PO) Take by mouth 2 (two) times a week.    . Cholecalciferol (VITAMIN D3) 1000 UNITS CAPS Take by mouth 2 (two) times a week.     . IODINE, KELP, PO Take 1 drop by mouth.    . levothyroxine (SYNTHROID, LEVOTHROID) 25 MCG tablet Take 1 tablet (25 mcg total) by mouth daily before breakfast. Patient needs to schedule a follow up appointment before more refills. 90 tablet 0  . psyllium (METAMUCIL) 58.6 % powder Take by mouth daily. 1 tablespoon Daily     No current facility-administered medications for this visit.     Allergies (verified) Codeine; Celebrex; Ciprofloxacin; Erythromycin; and Milnacipran hcl   PAST HISTORY  Family History Family History  Problem Relation Age of Onset  . Alcohol abuse Other   . Stroke Other   . Diabetes Other   . Heart attack Other  Social History Social History  Substance Use Topics  . Smoking status: Former Smoker -- 0.50 packs/day for 40 years    Types: Cigarettes    Start date: 03/27/1969    Quit date: 08/29/2012  . Smokeless tobacco: Never Used     Comment: quit smoking 2 years ago  . Alcohol Use: No     Are there smokers in your home (other than you)? No  Risk Factors Current exercise habits: The patient does not participate in regular exercise at present.  Dietary  issues discussed: None   Cardiac risk factors: family history of premature cardiovascular disease.  Depression Screen (Note: if answer to either of the following is "Yes", a more complete depression screening is indicated)   Over the past two weeks, have you felt down, depressed or hopeless? No  Over the past two weeks, have you felt little interest or pleasure in doing things? No  Have you lost interest or pleasure in daily life? No  Do you often feel hopeless? No  Do you cry easily over simple problems? Yes  Activities of Daily Living In your present state of health, do you have any difficulty performing the following activities?:  Driving? No Managing money?  No Feeding yourself? No Getting from bed to chair? No   Climbing a flight of stairs? No Preparing food and eating?: No Bathing or showering? No Getting dressed: No Getting to the toilet? No Using the toilet:No Moving around from place to place: No In the past year have you fallen or had a near fall?:No   Are you sexually active?  Yes  Do you have more than one partner?  No  Hearing Difficulties: No Do you often ask people to speak up or repeat themselves? No Do you experience ringing or noises in your ears? No Do you have difficulty understanding soft or whispered voices? No   Do you feel that you have a problem with memory? No  Do you often misplace items? No  Do you feel safe at home?  Yes  Cognitive Testing  Alert? Yes  Normal Appearance?Yes  Oriented to person? Yes  Place? Yes   Time? Yes  Recall of three objects?  Yes  Can perform simple calculations? Yes  Displays appropriate judgment?Yes  Can read the correct time from a watch face?Yes   Advanced Directives have been discussed with the patient? Yes  List the Names of Other Physician/Practitioners you currently use: 1.  Dr. Sherie Don medicine  Indicate any recent Medical Services you may have received from other than [facility] providers in the  past year (date may be approximate).  Immunization History  Administered Date(s) Administered  . Tdap 09/02/2013    Screening Tests Health Maintenance  Topic Date Due  . Hepatitis C Screening  1956-04-08  . HIV Screening  10/16/1969  . MAMMOGRAM  05/14/2013  . ZOSTAVAX  10/16/2014  . INFLUENZA VACCINE  09/03/2016 (Originally 07/30/2015)  . COLONOSCOPY  05/14/2021  . TETANUS/TDAP  09/03/2023    All answers were reviewed with the patient and necessary referrals were made:  Alaiyah Bollman, MD   02/25/2016   History reviewed: allergies, current medications, past family history, past medical history, past social history, past surgical history and problem list  Review of Systems A comprehensive review of systems was negative.    Objective:     Vision by Snellen chart: will call for eye report.   Body mass index is 24.39 kg/(m^2). BP 140/83 mmHg  Pulse 65  Ht 5\' 1"  (  1.549 m)  Wt 129 lb (58.514 kg)  BMI 24.39 kg/m2  SpO2 99%  BP 140/83 mmHg  Pulse 65  Ht 5\' 1"  (1.549 m)  Wt 129 lb (58.514 kg)  BMI 24.39 kg/m2  SpO2 99% General appearance: alert, cooperative and appears stated age Head: Normocephalic, without obvious abnormality, atraumatic Eyes: conj claer, EOMI< PEERLA Ears: normal TM's and external ear canals both ears Nose: Nares normal. Septum midline. Mucosa normal. No drainage or sinus tenderness. Throat: lips, mucosa, and tongue normal; teeth and gums normal Neck: no adenopathy, no carotid bruit, no JVD, supple, symmetrical, trachea midline and thyroid not enlarged, symmetric, no tenderness/mass/nodules Back: symmetric, no curvature. ROM normal. No CVA tenderness. Lungs: clear to auscultation bilaterally Breasts: normal appearance, no masses or tenderness Heart: regular rate and rhythm, S1, S2 normal, no murmur, click, rub or gallop Abdomen: soft, non-tender; bowel sounds normal; no masses,  no organomegaly Extremities: extremities normal, atraumatic, no  cyanosis or edema Pulses: 2+ and symmetric Skin: Skin color, texture, turgor normal. No rashes or lesions Lymph nodes: Cervical, supraclavicular, and axillary nodes normal. Neurologic: Alert and oriented X 3, normal strength and tone. Normal symmetric reflexes. Normal coordination and gait     Assessment:     Medicare wellness exam.        Plan:     During the course of the visit the patient was educated and counseled about appropriate screening and preventive services including:    Screening mammography   Reminded her to make an eye exam.   Declines shingles vaccine.   IFG - recheck A1C   Due for screening lipid and CMP  Discussed screening for hepatitis C. [name] okay with that but declined HIV screening.  Diet review for nutrition referral? Yes ____  Not Indicated __x__   Patient Instructions (the written plan) was given to the patient.  Medicare Attestation I have personally reviewed: The patient's medical and social history Their use of alcohol, tobacco or illicit drugs Their current medications and supplements The patient's functional ability including ADLs,fall risks, home safety risks, cognitive, and hearing and visual impairment Diet and physical activities Evidence for depression or mood disorders  The patient's weight, height, BMI, and visual acuity have been recorded in the chart.  I have made referrals, counseling, and provided education to the patient based on review of the above and I have provided the patient with a written personalized care plan for preventive services.     Anyela Napierkowski, MD   02/25/2016

## 2016-02-26 LAB — COMPLETE METABOLIC PANEL WITH GFR
ALT: 21 U/L (ref 6–29)
AST: 23 U/L (ref 10–35)
Albumin: 4.1 g/dL (ref 3.6–5.1)
Alkaline Phosphatase: 71 U/L (ref 33–130)
BUN: 11 mg/dL (ref 7–25)
CALCIUM: 9.5 mg/dL (ref 8.6–10.4)
CHLORIDE: 102 mmol/L (ref 98–110)
CO2: 26 mmol/L (ref 20–31)
Creat: 0.68 mg/dL (ref 0.50–0.99)
GFR, Est Non African American: >89 mL/min (ref >=60)
Glucose, Bld: 93 mg/dL (ref 65–99)
POTASSIUM: 4.4 mmol/L (ref 3.5–5.3)
Sodium: 139 mmol/L (ref 135–146)
Total Bilirubin: 0.6 mg/dL (ref 0.2–1.2)
Total Protein: 6.9 g/dL (ref 6.1–8.1)

## 2016-02-26 LAB — LIPID PANEL
CHOL/HDL RATIO: 2.8 ratio (ref <=5.0)
CHOLESTEROL: 192 mg/dL (ref 125–200)
HDL: 68 mg/dL (ref >=46)
LDL Cholesterol: 108 mg/dL (ref <130)
Triglycerides: 81 mg/dL (ref <150)
VLDL: 16 mg/dL (ref <30)

## 2016-02-26 LAB — HEPATITIS C ANTIBODY: HCV AB: NEGATIVE

## 2016-02-26 LAB — TSH: TSH: 0.62 m[IU]/L

## 2016-02-26 LAB — HEMOGLOBIN A1C
HEMOGLOBIN A1C: 6.1 % — AB (ref <5.7)
Mean Plasma Glucose: 128 mg/dL — ABNORMAL HIGH (ref <117)

## 2016-03-05 ENCOUNTER — Encounter: Payer: Self-pay | Admitting: Family Medicine

## 2016-03-11 ENCOUNTER — Telehealth: Payer: Self-pay | Admitting: *Deleted

## 2016-03-11 NOTE — Telephone Encounter (Signed)
Form completed,copied for scanning and mailed to: [name] .Marland KitchenAudelia Guerrero [place]

## 2016-03-17 ENCOUNTER — Telehealth: Payer: Self-pay

## 2016-03-18 NOTE — Telephone Encounter (Signed)
I sent referral through [employer] and received authorization [id number] good from 03/18/2016 - 09/14/2016 for 6 visits. I am going to have [place] call and schedule patient with Dr. Dianah Field. - CF

## 2016-03-19 ENCOUNTER — Ambulatory Visit (INDEPENDENT_AMBULATORY_CARE_PROVIDER_SITE_OTHER): Payer: Commercial Managed Care - HMO | Admitting: Sports Medicine

## 2016-03-19 ENCOUNTER — Encounter: Payer: Self-pay | Admitting: Sports Medicine

## 2016-03-19 VITALS — BP 142/86 | HR 66 | Resp 18 | Wt 133.8 lb

## 2016-03-19 DIAGNOSIS — M722 Plantar fascial fibromatosis: Secondary | ICD-10-CM

## 2016-03-19 NOTE — Progress Notes (Signed)
  Subjective:    CC: Bilateral heel pain  HPI: This is a pleasant 60 year old female, we injected her left plantar fascia tendon at months ago, she is now having recurrence of pain under both heels, severe, persistent without radiation. Desires repeat interventional treatment today.  Past medical history, Surgical history, Family history not pertinant except as noted below, Social history, Allergies, and medications have been entered into the medical record, reviewed, and no changes needed.   Review of Systems: No fevers, chills, night sweats, weight loss, chest pain, or shortness of breath.   Objective:    General: Well Developed, well nourished, and in no acute distress.  Neuro: Alert and oriented x3, extra-ocular muscles intact, sensation grossly intact.  HEENT: Normocephalic, atraumatic, pupils equal round reactive to light, neck supple, no masses, no lymphadenopathy, thyroid nonpalpable.  Skin: Warm and dry, no rashes. Cardiac: Regular rate and rhythm, no murmurs rubs or gallops, no lower extremity edema.  Respiratory: Clear to auscultation bilaterally. Not using accessory muscles, speaking in full sentences.  Procedure: Real-time Ultrasound Guided Injection of left plantar fascia origin Device: GE Logiq E  Verbal informed consent obtained.  Time-out conducted.  Noted no overlying erythema, induration, or other signs of local infection.  Skin prepped in a sterile fashion.  Local anesthesia: Topical Ethyl chloride.  With sterile technique and under real time ultrasound guidance:  25-gauge needle advanced just deep to the organ of the plantar fascia on the calcaneus, 1 mL kenalog 40, 1 mL lidocaine, 1 mL Marcaine injected easily. Completed without difficulty  Pain immediately resolved suggesting accurate placement of the medication.  Advised to call if fevers/chills, erythema, induration, drainage, or persistent bleeding.  Images permanently stored and available for review in the  ultrasound unit.  Impression: Technically successful ultrasound guided injection.  Procedure: Real-time Ultrasound Guided Injection of right plantar fascia origin Device: GE Logiq E  Verbal informed consent obtained.  Time-out conducted.  Noted no overlying erythema, induration, or other signs of local infection.  Skin prepped in a sterile fashion.  Local anesthesia: Topical Ethyl chloride.  With sterile technique and under real time ultrasound guidance:  25-gauge needle advanced just deep to the organ of the plantar fascia on the calcaneus, 1 mL kenalog 40, 1 mL lidocaine, 1 mL Marcaine injected easily. Completed without difficulty  Pain immediately resolved suggesting accurate placement of the medication.  Advised to call if fevers/chills, erythema, induration, drainage, or persistent bleeding.  Images permanently stored and available for review in the ultrasound unit.  Impression: Technically successful ultrasound guided injection.  Impression and Recommendations:

## 2016-03-19 NOTE — Assessment & Plan Note (Signed)
Previous injection of the left side was 10-1/2 months ago, repeat bilateral plantar fascia injection as above, return in one month.

## 2016-03-19 NOTE — Addendum Note (Signed)
Addended by: Elizabeth Sauer on: 03/19/2016 09:56 AM   Modules accepted: Medications

## 2016-04-16 ENCOUNTER — Ambulatory Visit (INDEPENDENT_AMBULATORY_CARE_PROVIDER_SITE_OTHER): Payer: Commercial Managed Care - HMO | Admitting: Sports Medicine

## 2016-04-16 ENCOUNTER — Encounter: Payer: Self-pay | Admitting: Sports Medicine

## 2016-04-16 VITALS — BP 135/80 | HR 61 | Resp 18 | Wt 131.9 lb

## 2016-04-16 DIAGNOSIS — M722 Plantar fascial fibromatosis: Secondary | ICD-10-CM

## 2016-04-16 NOTE — Assessment & Plan Note (Signed)
Doing well after bilateral plantar fascia injection, return as needed.

## 2016-04-16 NOTE — Progress Notes (Signed)
  Subjective:    CC:  Follow-up  HPI:  plantar fasciitis: Doing well after bilateral injection.  Past medical history, Surgical history, Family history not pertinant except as noted below, Social history, Allergies, and medications have been entered into the medical record, reviewed, and no changes needed.   Review of Systems: No fevers, chills, night sweats, weight loss, chest pain, or shortness of breath.   Objective:    General: Well Developed, well nourished, and in no acute distress.  Neuro: Alert and oriented x3, extra-ocular muscles intact, sensation grossly intact.  HEENT: Normocephalic, atraumatic, pupils equal round reactive to light, neck supple, no masses, no lymphadenopathy, thyroid nonpalpable.  Skin: Warm and dry, no rashes. Cardiac: Regular rate and rhythm, no murmurs rubs or gallops, no lower extremity edema.  Respiratory: Clear to auscultation bilaterally. Not using accessory muscles, speaking in full sentences.  Impression and Recommendations:

## 2016-05-07 ENCOUNTER — Other Ambulatory Visit: Payer: Self-pay | Admitting: Family Medicine

## 2016-05-16 ENCOUNTER — Ambulatory Visit (INDEPENDENT_AMBULATORY_CARE_PROVIDER_SITE_OTHER): Payer: Commercial Managed Care - HMO | Admitting: Physician Assistant

## 2016-05-16 ENCOUNTER — Encounter: Payer: Self-pay | Admitting: Physician Assistant

## 2016-05-16 VITALS — BP 110/67 | HR 75 | Temp 98.8°F | Ht 61.0 in | Wt 131.0 lb

## 2016-05-16 DIAGNOSIS — J029 Acute pharyngitis, unspecified: Secondary | ICD-10-CM

## 2016-05-16 LAB — POCT RAPID STREP A (OFFICE): RAPID STREP A SCREEN: NEGATIVE

## 2016-05-16 MED ORDER — AMOXICILLIN-POT CLAVULANATE 875-125 MG PO TABS: 1.0000 | ORAL_TABLET | Freq: Two times a day (BID) | ORAL | Status: DC

## 2016-05-16 NOTE — Progress Notes (Signed)
   Subjective:    Patient ID: Ann Guerrero, female    DOB: 02/03/1956, 60 y.o.   MRN: [id number]  HPI  Pt presents to the clinic with 3 days of ST. She has had a low grade fever 100. She has a cough with clear to yellow sputum. No SOB, wheezing, chills. [name] taking sudafed. She had a granddaughter with strep last week. [name] very tired.    Review of Systems     Objective:   Physical Exam  Constitutional: [name] oriented to person, place, and time. She appears well-developed and well-nourished.  HENT:  Head: Normocephalic and atraumatic.  No tonsils.  Oropharynx erythematous. No exudate.  TM's clear bilaterally.  Negative for sinus pressure to palpation.   Eyes: Conjunctivae are normal. Right eye exhibits no discharge. Left eye exhibits no discharge.  Cardiovascular: Normal rate, regular rhythm and normal heart sounds.   Pulmonary/Chest: Effort normal and breath sounds normal. She has no wheezes.  Lymphadenopathy:    She has cervical adenopathy.  Neurological: [name] alert and oriented to person, place, and time.  Skin: Skin is dry.  Psychiatric: She has a normal mood and affect. Her behavior is normal.          Assessment & Plan:  Acute pharyngitis- likely viral. Rapid strep negative in office. Discussed sympatomatic care with salt water gargles, ibuprofen, lozengers. augmentin given to start if worsening or not improving in 2 days.

## 2016-05-16 NOTE — Patient Instructions (Signed)

## 2016-06-25 ENCOUNTER — Other Ambulatory Visit: Payer: Self-pay | Admitting: Sports Medicine

## 2016-07-07 DIAGNOSIS — Z0100 Encounter for examination of eyes and vision without abnormal findings: Secondary | ICD-10-CM | POA: Diagnosis not present

## 2016-07-07 DIAGNOSIS — H04123 Dry eye syndrome of bilateral lacrimal glands: Secondary | ICD-10-CM | POA: Diagnosis not present

## 2016-07-22 ENCOUNTER — Telehealth: Payer: Self-pay | Admitting: *Deleted

## 2016-07-22 NOTE — Telephone Encounter (Signed)
Pt informed of recommendations.Ann Guerrero Lynetta  

## 2016-07-22 NOTE — Telephone Encounter (Signed)
Pt called and lvm stating that she left town and forgot to pack her thyroid med. She left on Saturday and will not be returning until late Saturday night Sunday morning and wanted to know if it will be ok for her to go without taking this medication until she returns. Will fwd to pcp for advice.Ann Guerrero

## 2016-07-22 NOTE — Telephone Encounter (Signed)
Ok to skip for now.

## 2016-09-24 ENCOUNTER — Other Ambulatory Visit: Payer: Self-pay | Admitting: Family Medicine

## 2016-10-31 ENCOUNTER — Telehealth: Payer: Self-pay | Admitting: Family Medicine

## 2016-11-05 NOTE — Telephone Encounter (Signed)
Noted  

## 2016-11-05 NOTE — Telephone Encounter (Signed)
Called Pt to see if she would like me to send Rx to local pharmacy since it is only for a 30 day supply- Pt states if the Rx is not 90 days then "just forget about it." Advised Pt if she would schedule an appointment I could talk to the Provider and see if a 90 day supply could be sent. Pt states "I have too much going on right now to make an appointment with her so just forget it." Advised Pt if she changes her mind to contact clinic and we would be happy to work with her. Pt states again "just forget about it" and hung up.   Rx will be shredded.

## 2016-11-19 LAB — FECAL OCCULT BLOOD, GUAIAC: Fecal Occult Blood: NEGATIVE

## 2017-01-15 ENCOUNTER — Encounter: Payer: Self-pay | Admitting: Family Medicine

## 2017-02-11 ENCOUNTER — Telehealth: Payer: Self-pay | Admitting: Physician Assistant

## 2017-02-11 ENCOUNTER — Other Ambulatory Visit: Payer: Self-pay | Admitting: Physician Assistant

## 2017-02-11 DIAGNOSIS — Z20828 Contact with and (suspected) exposure to other viral communicable diseases: Secondary | ICD-10-CM

## 2017-02-11 MED ORDER — OSELTAMIVIR PHOSPHATE 75 MG PO CAPS: 75.0000 mg | ORAL_CAPSULE | Freq: Every day | ORAL | 0 refills | Status: DC

## 2017-02-11 NOTE — Telephone Encounter (Signed)
Patient's husband seen by me in clinic today and diagnosed with influenza-like illness. Per guidelines, treating household contacts prophylactically with Tamiflu for 10 days. No history of renal disease.  Lab Results  Component Value Date   CREATININE 0.68 02/25/2016    1. Exposure to influenza - oseltamivir (TAMIFLU) 75 MG capsule; Take 1 capsule (75 mg total) by mouth daily.  Dispense: 10 capsule; Refill: 0

## 2017-02-13 ENCOUNTER — Encounter: Payer: Self-pay | Admitting: Family Medicine

## 2017-02-13 ENCOUNTER — Ambulatory Visit (INDEPENDENT_AMBULATORY_CARE_PROVIDER_SITE_OTHER): Payer: Medicare HMO | Admitting: Family Medicine

## 2017-02-13 VITALS — BP 121/72 | HR 67 | Ht 61.0 in | Wt 131.0 lb

## 2017-02-13 DIAGNOSIS — L2082 Flexural eczema: Secondary | ICD-10-CM

## 2017-02-13 DIAGNOSIS — L235 Allergic contact dermatitis due to other chemical products: Secondary | ICD-10-CM

## 2017-02-13 MED ORDER — TRIAMCINOLONE ACETONIDE 0.025 % EX CREA
1.0000 | TOPICAL_CREAM | Freq: Two times a day (BID) | CUTANEOUS | 0 refills | Status: DC
Start: 2017-02-13 — End: 2017-08-14
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
## 2017-02-13 NOTE — Patient Instructions (Addendum)
Atopic Dermatitis Atopic dermatitis is a skin disorder that causes inflammation of the skin. This is the most common type of eczema. Eczema is a group of skin conditions that cause the skin to be itchy, red, and swollen. This condition is generally worse during the cooler winter months and often improves during the warm summer months. Symptoms can vary from person to person. Atopic dermatitis usually starts showing signs in infancy and can last through adulthood. This condition cannot be passed from one person to another (non-contagious), but is more common in families. Atopic dermatitis may not always be present. When it is present, it is called a flare-up. What are the causes? The exact cause of this condition is not known. Flare-ups of the condition may be triggered by:  Contact with something you are sensitive or allergic to.  Stress.  Certain foods.  Extremely hot or cold weather.  Harsh chemicals and soaps.  Dry air.  Chlorine. What increases the risk? This condition is more likely to develop in people who have a personal history or family history of eczema, allergies, asthma, or hay fever. What are the signs or symptoms? Symptoms of this condition include:  Dry, scaly skin.  Red, itchy rash.  Itchiness, which can be severe. This may occur before the skin rash. This can make sleeping difficult.  Skin thickening and cracking can occur over time. How is this diagnosed? This condition is diagnosed based on your symptoms, a medical history, and a physical exam. How is this treated? There is no cure for this condition, but symptoms can usually be controlled. Treatment focuses on:  Controlling the itching and scratching. You may be given medicines, such as antihistamines or steroid creams.  Limiting exposure to things that you are sensitive or allergic to (allergens).  Recognizing situations that cause stress and developing a plan to manage stress. If your atopic dermatitis  does not get better with medicines or is all over your body (widespread) , a treatment using a specific type of light (phototherapy) may be used. Follow these instructions at home: Skin care  Keep your skin well-moisturized. This seals in moisture and help prevent dryness.  Use unscented lotions that have petroleum in them.  Avoid lotions that contain alcohol and water. They can dry the skin.  Keep baths or showers short (less than 5 minutes) in warm water. Do not use hot water.  Use mild, unscented cleansers for bathing. Avoid soap and bubble bath.  Apply a moisturizer to your skin right after a bath or shower.   Do not apply anything to your skin without checking with your health care provider. General instructions  Dress in clothes made of cotton or cotton blends. Dress lightly because heat increases itching.  When washing your clothes, rinse your clothes twice so all of the soap is removed.  Avoid any triggers that can cause a flare-up.  Try to manage your stress.  Keep your fingernails cut short.  Avoid scratching. Scratching makes the rash and itching worse. It may also result in a skin infection (impetigo) due to a break in the skin caused by scratching.  Take or apply over-the-counter and prescription medicines only as told by your health care provider.  Keep all follow-up visits as told by your health care provider. This is important.  Do not be around people who have cold sores or fever blisters. If you get the infection, it may cause your atopic dermatitis to worsen. Contact a health care provider if:  Your itching  interferes with sleep.  Your rash gets worse or is not better within one week of starting treatment.  You have a fever.  You have a rash flare-up after having contact with someone who has cold sores or fever blisters. Get help right away if:  You develop pus or soft yellow scabs in the rash area. Summary  This condition causes a red rash and  itchy, dry, scaly skin.  Treatment focuses on controlling the itching and scratching, limiting exposure to things that you are sensitive or allergic to (allergens), and recognizing situations that cause stress and developing a plan to manage stress.  Keep your skin well-moisturized.  Keep baths or showers less than 5 minutes. This information is not intended to replace advice given to you by your health care provider. Make sure you discuss any questions you have with your health care provider. Document Released: 12/12/2000 Document Revised: 05/22/2016 Document Reviewed: 07/18/2013 Elsevier Interactive Patient Education  2017 Elsevier Inc.  

## 2017-03-02 ENCOUNTER — Telehealth: Payer: Self-pay | Admitting: Family Medicine

## 2017-05-05 ENCOUNTER — Encounter: Payer: Self-pay | Admitting: Sports Medicine

## 2017-05-05 ENCOUNTER — Ambulatory Visit (INDEPENDENT_AMBULATORY_CARE_PROVIDER_SITE_OTHER): Payer: Medicare HMO | Admitting: Sports Medicine

## 2017-05-05 ENCOUNTER — Telehealth: Payer: Self-pay | Admitting: Sports Medicine

## 2017-05-05 DIAGNOSIS — M170 Bilateral primary osteoarthritis of knee: Secondary | ICD-10-CM | POA: Diagnosis not present

## 2017-05-05 NOTE — Telephone Encounter (Signed)
Submitted for approval on Orthovisc. Awaiting confirmation.  

## 2017-05-05 NOTE — Telephone Encounter (Signed)
-----   Message from Silverio Decamp, MD sent at 05/05/2017  2:39 PM EDT ----- Hi Boogs,  Bilateral orthovisc approval please.  She did well with the left knee 2 years ago. ___________________________________________ Gwen Her. Dianah Field, M.D., ABFM., CAQSM. Primary Care and [place] [employer] of [employer] [facility] [employer]

## 2017-05-05 NOTE — Assessment & Plan Note (Signed)
We finished Orthovisc in February 2016. This needs just starting to hurt. Right knee is having significant pain so this will be injected today. I am going to work on getting approval for Orthovisc for both knees

## 2017-05-05 NOTE — Progress Notes (Signed)
  Subjective:    CC: Follow-up  HPI: Right knee pain: Known bilateral osteoarthritis, we finished Orthovisc in the left knee two years and 3 months ago.  Having pain down the right knee, and only a bit in the left knee. Right knee pain is moderate, persistent, localized the medial joint line.  Past medical history:  Negative.  See flowsheet/record as well for more information.  Surgical history: Negative.  See flowsheet/record as well for more information.  Family history: Negative.  See flowsheet/record as well for more information.  Social history: Negative.  See flowsheet/record as well for more information.  Allergies, and medications have been entered into the medical record, reviewed, and no changes needed.   Review of Systems: No fevers, chills, night sweats, weight loss, chest pain, or shortness of breath.   Objective:    General: Well Developed, well nourished, and in no acute distress.  Neuro: Alert and oriented x3, extra-ocular muscles intact, sensation grossly intact.  HEENT: Normocephalic, atraumatic, pupils equal round reactive to light, neck supple, no masses, no lymphadenopathy, thyroid nonpalpable.  Skin: Warm and dry, no rashes. Cardiac: Regular rate and rhythm, no murmurs rubs or gallops, no lower extremity edema.  Respiratory: Clear to auscultation bilaterally. Not using accessory muscles, speaking in full sentences. Right Knee: Normal to inspection with no erythema or effusion or obvious bony abnormalities. Tender to palpation at the medial joint line ROM normal in flexion and extension and lower leg rotation. Ligaments with solid consistent endpoints including ACL, PCL, LCL, MCL. Negative Mcmurray's and provocative meniscal tests. Non painful patellar compression. Patellar and quadriceps tendons unremarkable. Hamstring and quadriceps strength is normal.  Procedure: Real-time Ultrasound Guided Injection of right knee Device: GE Logiq E  Verbal informed consent  obtained.  Time-out conducted.  Noted no overlying erythema, induration, or other signs of local infection.  Skin prepped in a sterile fashion.  Local anesthesia: Topical Ethyl chloride.  With sterile technique and under real time ultrasound guidance:  1 mL Kenalog 40, 2 mL lidocaine, 2 mL bupivacaine injected easily Completed without difficulty  Pain immediately resolved suggesting accurate placement of the medication.  Advised to call if fevers/chills, erythema, induration, drainage, or persistent bleeding.  Images permanently stored and available for review in the ultrasound unit.  Impression: Technically successful ultrasound guided injection.  Impression and Recommendations:    Osteoarthritis of both knees We finished Orthovisc in February 2016. This needs just starting to hurt. Right knee is having significant pain so this will be injected today. I am going to work on getting approval for Orthovisc for both knees

## 2017-06-02 NOTE — Telephone Encounter (Signed)
Pt scheduled  

## 2017-06-02 NOTE — Telephone Encounter (Signed)
Received the following information from [employer] benefits investigation:   Orthovisc is covered and is a part B drug. The plan will cover 80% of the allowable amount. The patient has a 20% coinsurance responsibility. If an office visit is billed there will be a $40 office visit copay as well. No PA is required. Buy and [employer] is permitted. Call reference number [id number].  Spoke with Pt and gave an estimated OOP price. [name] going to think on it and let us know.

## 2017-06-04 ENCOUNTER — Ambulatory Visit (INDEPENDENT_AMBULATORY_CARE_PROVIDER_SITE_OTHER): Payer: Medicare HMO | Admitting: Sports Medicine

## 2017-06-04 ENCOUNTER — Encounter: Payer: Self-pay | Admitting: Sports Medicine

## 2017-06-04 DIAGNOSIS — M170 Bilateral primary osteoarthritis of knee: Secondary | ICD-10-CM | POA: Diagnosis not present

## 2017-06-04 NOTE — Progress Notes (Signed)

## 2017-06-04 NOTE — Assessment & Plan Note (Signed)
Orthovisc injection #1 of 4 into both knees.

## 2017-06-11 ENCOUNTER — Ambulatory Visit (INDEPENDENT_AMBULATORY_CARE_PROVIDER_SITE_OTHER): Payer: Medicare HMO | Admitting: Sports Medicine

## 2017-06-11 ENCOUNTER — Encounter: Payer: Self-pay | Admitting: Sports Medicine

## 2017-06-11 DIAGNOSIS — M170 Bilateral primary osteoarthritis of knee: Secondary | ICD-10-CM | POA: Diagnosis not present

## 2017-06-11 DIAGNOSIS — R208 Other disturbances of skin sensation: Secondary | ICD-10-CM

## 2017-06-11 NOTE — Assessment & Plan Note (Signed)
Suspect peripheral neuropathy. Adding labs, we will see how things go, we may have to proceed with nerve conduction and EMG.

## 2017-06-11 NOTE — Progress Notes (Signed)
   Subjective:    I'm seeing this patient as a consultation for:  Dr. Beatrice Lecher  CC: Burning feet  HPI: This is a pleasant 61 year old female with no history of diabetes or peripheral neuropathy with a several day history of burning on the plantar aspect of her feet, mild, persistent. No trauma, denies any back pain.  [name] also here for Orthovisc injection #2 of 4 into both knees. Not yet feeling relief.  Past medical history:  Negative.  See flowsheet/record as well for more information.  Surgical history: Negative.  See flowsheet/record as well for more information.  Family history: Negative.  See flowsheet/record as well for more information.  Social history: Negative.  See flowsheet/record as well for more information.  Allergies, and medications have been entered into the medical record, reviewed, and no changes needed.   Review of Systems: No headache, visual changes, nausea, vomiting, diarrhea, constipation, dizziness, abdominal pain, skin rash, fevers, chills, night sweats, weight loss, swollen lymph nodes, body aches, joint swelling, muscle aches, chest pain, shortness of breath, mood changes, visual or auditory hallucinations.   Objective:   General: Well Developed, well nourished, and in no acute distress.  Neuro/Psych: Alert and oriented x3, extra-ocular muscles intact, able to move all 4 extremities, sensation grossly intact. Skin: Warm and dry, no rashes noted.  Respiratory: Not using accessory muscles, speaking in full sentences, trachea midline.  Cardiovascular: Pulses palpable, no extremity edema. Abdomen: Does not appear distended.  Procedure: Real-time Ultrasound Guided Injection of left knee Device: GE Logiq E  Verbal informed consent obtained.  Time-out conducted.  Noted no overlying erythema, induration, or other signs of local infection.  Skin prepped in a sterile fashion.  Local anesthesia: Topical Ethyl chloride.  With sterile technique and under  real time ultrasound guidance:   30 mg/2 mL of OrthoVisc (sodium hyaluronate) in a prefilled syringe was injected easily into the knee through a 22-gauge needle. Completed without difficulty  Pain immediately resolved suggesting accurate placement of the medication.  Advised to call if fevers/chills, erythema, induration, drainage, or persistent bleeding.  Images permanently stored and available for review in the ultrasound unit.  Impression: Technically successful ultrasound guided injection.  Procedure: Real-time Ultrasound Guided Injection of right knee Device: GE Logiq E  Verbal informed consent obtained.  Time-out conducted.  Noted no overlying erythema, induration, or other signs of local infection.  Skin prepped in a sterile fashion.  Local anesthesia: Topical Ethyl chloride.  With sterile technique and under real time ultrasound guidance:   30 mg/2 mL of OrthoVisc (sodium hyaluronate) in a prefilled syringe was injected easily into the knee through a 22-gauge needle. Completed without difficulty  Pain immediately resolved suggesting accurate placement of the medication.  Advised to call if fevers/chills, erythema, induration, drainage, or persistent bleeding.  Images permanently stored and available for review in the ultrasound unit.  Impression: Technically successful ultrasound guided injection.  Impression and Recommendations:   This case required medical decision making of moderate complexity.  Primary osteoarthritis of both knees Orthovisc No. 2 of 4 into both knees. Return in one week for #3 of 4.  Burning sensation of feet Suspect peripheral neuropathy. Adding labs, we will see how things go, we may have to proceed with nerve conduction and EMG.

## 2017-06-11 NOTE — Assessment & Plan Note (Signed)
Orthovisc No. 2 of 4 into both knees. Return in one week for #3 of 4.

## 2017-06-12 LAB — COMPREHENSIVE METABOLIC PANEL
Albumin: 4 g/dL (ref 3.6–5.1)
BUN: 9 mg/dL (ref 7–25)
Chloride: 103 mmol/L (ref 98–110)
Potassium: 4.3 mmol/L (ref 3.5–5.3)
Total Protein: 6.3 g/dL (ref 6.1–8.1)

## 2017-06-12 LAB — COMPREHENSIVE METABOLIC PANEL WITH GFR
ALT: 18 U/L (ref 6–29)
AST: 18 U/L (ref 10–35)
Alkaline Phosphatase: 73 U/L (ref 33–130)
CO2: 26 mmol/L (ref 20–31)
Calcium: 9.2 mg/dL (ref 8.6–10.4)
Creat: 0.81 mg/dL (ref 0.50–0.99)
Glucose, Bld: 109 mg/dL — ABNORMAL HIGH (ref 65–99)
Sodium: 139 mmol/L (ref 135–146)
Total Bilirubin: 0.4 mg/dL (ref 0.2–1.2)

## 2017-06-12 LAB — TSH: TSH: 1.76 m[IU]/L

## 2017-06-12 LAB — IRON AND TIBC
%SAT: 28 % (ref 11–50)
Iron: 86 ug/dL (ref 45–160)
TIBC: 302 ug/dL (ref 250–450)
UIBC: 216 ug/dL

## 2017-06-12 LAB — CBC
HCT: 43.5 % (ref 35.0–45.0)
Hemoglobin: 14.6 g/dL (ref 11.7–15.5)
MCH: 30.7 pg (ref 27.0–33.0)
MCHC: 33.6 g/dL (ref 32.0–36.0)
MCV: 91.6 fL (ref 80.0–100.0)
MPV: 9.5 fL (ref 7.5–12.5)
Platelets: 332 10*3/uL (ref 140–400)
RBC: 4.75 MIL/uL (ref 3.80–5.10)
RDW: 13.6 % (ref 11.0–15.0)
WBC: 6.1 K/uL (ref 3.8–10.8)

## 2017-06-13 LAB — HEMOGLOBIN A1C
Hgb A1c MFr Bld: 5.9 % — ABNORMAL HIGH (ref <5.7)
Mean Plasma Glucose: 123 mg/dL

## 2017-06-13 LAB — FERRITIN: Ferritin: 113 ng/mL (ref 20–288)

## 2017-06-13 LAB — FOLATE: Folate: 9 ng/mL (ref >5.4)

## 2017-06-13 LAB — VITAMIN B12: Vitamin B-12: 307 pg/mL (ref 200–1100)

## 2017-06-14 LAB — HEAVY METALS PANEL, BLOOD
Arsenic: 3 mcg/L (ref <23)
Lead: 2 ug/dL (ref <5)
Mercury, B: <4 mcg/L (ref <=10)

## 2017-06-18 ENCOUNTER — Ambulatory Visit (INDEPENDENT_AMBULATORY_CARE_PROVIDER_SITE_OTHER): Payer: Medicare HMO | Admitting: Sports Medicine

## 2017-06-18 ENCOUNTER — Encounter: Payer: Self-pay | Admitting: Sports Medicine

## 2017-06-18 DIAGNOSIS — R208 Other disturbances of skin sensation: Secondary | ICD-10-CM

## 2017-06-18 DIAGNOSIS — M170 Bilateral primary osteoarthritis of knee: Secondary | ICD-10-CM

## 2017-06-18 NOTE — Progress Notes (Signed)
  Subjective:    CC: Follow-up  HPI: Knee osteoarthritis: Here for Orthovisc No. 3 into both knees. Starting to feel a small improvement.  Feet burning: Likely peripheral neuropathy, extensive panel of blood work was negative. Pain is intermittent, burning is intermittent so she doesn't desire any further workup.  Past medical history:  Negative.  See flowsheet/record as well for more information.  Surgical history: Negative.  See flowsheet/record as well for more information.  Family history: Negative.  See flowsheet/record as well for more information.  Social history: Negative.  See flowsheet/record as well for more information.  Allergies, and medications have been entered into the medical record, reviewed, and no changes needed.   Review of Systems: No fevers, chills, night sweats, weight loss, chest pain, or shortness of breath.   Objective:    General: Well Developed, well nourished, and in no acute distress.  Neuro: Alert and oriented x3, extra-ocular muscles intact, sensation grossly intact.  HEENT: Normocephalic, atraumatic, pupils equal round reactive to light, neck supple, no masses, no lymphadenopathy, thyroid nonpalpable.  Skin: Warm and dry, no rashes. Cardiac: Regular rate and rhythm, no murmurs rubs or gallops, no lower extremity edema.  Respiratory: Clear to auscultation bilaterally. Not using accessory muscles, speaking in full sentences.  Procedure: Real-time Ultrasound Guided Injection of left knee Device: GE Logiq E  Verbal informed consent obtained.  Time-out conducted.  Noted no overlying erythema, induration, or other signs of local infection.  Skin prepped in a sterile fashion.  Local anesthesia: Topical Ethyl chloride.  With sterile technique and under real time ultrasound guidance:   30 mg/2 mL of OrthoVisc (sodium hyaluronate) in a prefilled syringe was injected easily into the knee through a 22-gauge needle. Completed without difficulty  Pain  immediately resolved suggesting accurate placement of the medication.  Advised to call if fevers/chills, erythema, induration, drainage, or persistent bleeding.  Images permanently stored and available for review in the ultrasound unit.  Impression: Technically successful ultrasound guided injection.  Procedure: Real-time Ultrasound Guided Injection of right knee Device: GE Logiq E  Verbal informed consent obtained.  Time-out conducted.  Noted no overlying erythema, induration, or other signs of local infection.  Skin prepped in a sterile fashion.  Local anesthesia: Topical Ethyl chloride.  With sterile technique and under real time ultrasound guidance:   30 mg/2 mL of OrthoVisc (sodium hyaluronate) in a prefilled syringe was injected easily into the knee through a 22-gauge needle. Completed without difficulty  Pain immediately resolved suggesting accurate placement of the medication.  Advised to call if fevers/chills, erythema, induration, drainage, or persistent bleeding.  Images permanently stored and available for review in the ultrasound unit.  Impression: Technically successful ultrasound guided injection.  Impression and Recommendations:    Primary osteoarthritis of both knees Orthovisc injection #3 of 4 into both knees, return in one week for #4 of 4.  Burning sensation of feet Lab work is negative, since symptoms are so mild and intermittent she doesn't desire any aggressive workup such as nerve conduction study, she can return as needed for this.

## 2017-06-18 NOTE — Assessment & Plan Note (Signed)
Lab work is negative, since symptoms are so mild and intermittent she doesn't desire any aggressive workup such as nerve conduction study, she can return as needed for this.

## 2017-06-18 NOTE — Assessment & Plan Note (Signed)
Orthovisc injection #3 of 4 into both knees, return in one week for 4 of 4. 

## 2017-06-25 ENCOUNTER — Ambulatory Visit (INDEPENDENT_AMBULATORY_CARE_PROVIDER_SITE_OTHER): Payer: Medicare HMO | Admitting: Sports Medicine

## 2017-06-25 DIAGNOSIS — M170 Bilateral primary osteoarthritis of knee: Secondary | ICD-10-CM | POA: Diagnosis not present

## 2017-06-25 NOTE — Progress Notes (Signed)

## 2017-06-25 NOTE — Assessment & Plan Note (Signed)
Orthovisc injection #4 of 4 into both knees, return as needed. 

## 2017-07-22 ENCOUNTER — Telehealth: Payer: Self-pay | Admitting: Family Medicine

## 2017-07-22 NOTE — Telephone Encounter (Signed)
Patient called advised that she spoke to you about her grand daughter being a new pt of yours and she said she mentioned that she has medicaid and that you would accept her as a new pt. I adv Ms. Kavita that our office does not accept Medicaid [place] Access but she told me to ask you because you said you would accept her as a new pt. Please Adv. We do have a few long time pt's that are teenagers that have medicaid [place] Access that still come to our office I didn't tell Zella that but I assume maybe you were making an exception so Im sending a phone message. Please adv. Thanks

## 2017-07-22 NOTE — Telephone Encounter (Signed)
Ok will call Tylyn and advise her of this information. Thanks

## 2017-07-22 NOTE — Telephone Encounter (Signed)
Please tell her unfortunately I cannot. Again if it's regular Medicaid I can assess his [place] access I cannot. It is not a matter of me not wanting to see her it is the fact that we are not contracted with [place] access.

## 2017-08-14 ENCOUNTER — Ambulatory Visit (INDEPENDENT_AMBULATORY_CARE_PROVIDER_SITE_OTHER): Payer: Medicare HMO | Admitting: Family Medicine

## 2017-08-14 ENCOUNTER — Encounter: Payer: Self-pay | Admitting: Family Medicine

## 2017-08-14 ENCOUNTER — Other Ambulatory Visit: Payer: Self-pay | Admitting: Family Medicine

## 2017-08-14 ENCOUNTER — Telehealth: Payer: Self-pay

## 2017-08-14 VITALS — BP 128/77 | HR 65 | Wt 139.0 lb

## 2017-08-14 DIAGNOSIS — F5101 Primary insomnia: Secondary | ICD-10-CM | POA: Diagnosis not present

## 2017-08-14 DIAGNOSIS — L308 Other specified dermatitis: Secondary | ICD-10-CM | POA: Diagnosis not present

## 2017-08-14 MED ORDER — SUVOREXANT 10 MG PO TABS: 1.0000 | ORAL_TABLET | Freq: Every day | ORAL | 0 refills | Status: DC

## 2017-08-14 MED ORDER — SUVOREXANT 20 MG PO TABS: 1.0000 | ORAL_TABLET | Freq: Every day | ORAL | 0 refills | Status: DC

## 2017-08-14 MED ORDER — ALPRAZOLAM 2 MG PO TABS: 2.0000 mg | ORAL_TABLET | Freq: Every evening | ORAL | 0 refills | Status: DC | PRN

## 2017-08-14 MED ORDER — LEVOTHYROXINE SODIUM 25 MCG PO TABS: ORAL_TABLET | ORAL | 3 refills | Status: DC

## 2017-08-14 MED ORDER — SUVOREXANT 15 MG PO TABS: 1.0000 | ORAL_TABLET | Freq: Every day | ORAL | 0 refills | Status: DC

## 2017-08-14 NOTE — Telephone Encounter (Signed)
Okay, to fax prescription to [facility]. It was printed. If she wants to go ahead and at least try the free 10 tabs of the Belsomra with a coupon card and she can if she would like until she gets the prescription from [facility].

## 2017-08-14 NOTE — Progress Notes (Signed)
   Subjective:    Patient ID: Ann Guerrero, female    DOB: November 20, 1956, 61 y.o.   MRN: [id number]  HPI  61 year old female comes in today to discuss insomnia. She weaned herself slowly off of alprazolam she which she was using consistently to help with sleep. Since then she's been trying some over-the-counter medications without significant relief. In fact in the past week she says she's probably only slept a total of 1 hour. [name] miserable. She met she has been a little bit more stress than usual. She's worried about her granddaughter he has an eating disorder.  She also noticed and itchiness over the abdomen and sides. She started using coconut oil think he was her eczema but didn't really help. She then switched to steroid cream and that didn't help either. Now it feels a little bit better in the from the abdomen but she feels like it's more towards her low back. And then noticed a little red spot on her right neck. She's not sure what's causing it.  Review of Systems     Objective:   Physical Exam  Constitutional: [name] oriented to person, place, and time. She appears well-developed and well-nourished.  HENT:  Head: Normocephalic and atraumatic.  Eyes: Conjunctivae and EOM are normal.  Cardiovascular: Normal rate.   Pulmonary/Chest: Effort normal.  Neurological: [name] alert and oriented to person, place, and time.  Skin: Skin is dry. No pallor.  Psychiatric: She has a normal mood and affect. Her behavior is normal.  Vitals reviewed.         Assessment & Plan:  Insomnia - Not doing well. She was primarily relying on alprazolam to help her sleep. We are going to try Belsomra instead. 10 tabs of 3 different strengths given with a coupon card for her to trial her own at home. In the past she had nightmares with Lunesta and the Ambien was ineffective. If she is doing well she'll call me back if not we can always consider going back on the alprazolam that I did warn about the  potential side effects of continuing with long-term benzos for sleep.  Eczema-I don't see distinct rash but clearly the skin is itchy. Recommend that she try the steroid cream for a week. She really only tried it for a couple of days. Continue to moisturize well and call if rash continues to spread.

## 2017-08-14 NOTE — Telephone Encounter (Signed)
Ann Guerrero called and states the [name] has a $300 dollar co-pay. I asked about the co-pay card and even though she has not tried the co-pay card she states it will still be to high. She would like the xanax 2 mg refilled. Please advise.

## 2017-08-17 NOTE — Telephone Encounter (Signed)
Patient advised.

## 2017-08-19 ENCOUNTER — Telehealth: Payer: Self-pay | Admitting: *Deleted

## 2017-08-19 NOTE — Telephone Encounter (Signed)
Pt informed that she can take 2 of belsomra 10 mg to see if this helps with sleep. Maryruth Eve, Lahoma Crocker

## 2017-08-27 DIAGNOSIS — H04123 Dry eye syndrome of bilateral lacrimal glands: Secondary | ICD-10-CM | POA: Diagnosis not present

## 2017-08-27 DIAGNOSIS — H5203 Hypermetropia, bilateral: Secondary | ICD-10-CM | POA: Diagnosis not present

## 2017-08-27 DIAGNOSIS — H524 Presbyopia: Secondary | ICD-10-CM | POA: Diagnosis not present

## 2017-09-28 NOTE — Telephone Encounter (Signed)
Issue resolved.

## 2017-10-11 ENCOUNTER — Other Ambulatory Visit: Payer: Self-pay | Admitting: Emergency Medicine

## 2017-10-11 ENCOUNTER — Emergency Department
Admission: EM | Admit: 2017-10-11 | Discharge: 2017-10-11 | Disposition: A | Discharge status: Home / Self Care | Payer: Medicare HMO | Source: Home / Self Care | Attending: Emergency Medicine | Admitting: Emergency Medicine

## 2017-10-11 ENCOUNTER — Encounter: Payer: Self-pay | Admitting: Emergency Medicine

## 2017-10-11 DIAGNOSIS — N3090 Cystitis, unspecified without hematuria: Secondary | ICD-10-CM

## 2017-10-11 DIAGNOSIS — N3000 Acute cystitis without hematuria: Secondary | ICD-10-CM | POA: Diagnosis not present

## 2017-10-11 DIAGNOSIS — N3001 Acute cystitis with hematuria: Secondary | ICD-10-CM | POA: Diagnosis not present

## 2017-10-11 DIAGNOSIS — N390 Urinary tract infection, site not specified: Secondary | ICD-10-CM

## 2017-10-11 LAB — POCT URINALYSIS DIP (MANUAL ENTRY)
Glucose, UA: NEGATIVE mg/dL
Nitrite, UA: POSITIVE — AB
Protein Ur, POC: >=300 mg/dL — AB
Spec Grav, UA: 1.015 (ref 1.010–1.025)
Urobilinogen, UA: 1 E.U./dL
pH, UA: 6.5 (ref 5.0–8.0)

## 2017-10-11 MED ORDER — CEFTRIAXONE SODIUM 1 G IJ SOLR
1.0000 g | INTRAMUSCULAR | Status: AC
  Administered 2017-10-11: 1 g via INTRAMUSCULAR

## 2017-10-11 MED ORDER — PHENAZOPYRIDINE HCL 200 MG PO TABS: ORAL_TABLET | ORAL | 0 refills | Status: DC

## 2017-10-11 MED ORDER — CEPHALEXIN 500 MG PO CAPS: 500.0000 mg | ORAL_CAPSULE | Freq: Three times a day (TID) | ORAL | 0 refills | Status: DC
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
## 2017-10-11 NOTE — Discharge Instructions (Signed)
Today, we gave you a shot of Rocephin- antibiotic, to "jumpstart" your treatment for severe urinary tract infection.

## 2017-10-11 NOTE — ED Triage Notes (Signed)
Patient presents to [facility] with a complaint of dysuria.

## 2017-10-13 ENCOUNTER — Telehealth: Payer: Self-pay | Admitting: *Deleted

## 2017-10-13 NOTE — Telephone Encounter (Signed)
ENCOUNTER CREATED TO ADD UA ORDER & RESULTS NOT ENTERED ON DOS 10/11/17.

## 2017-10-14 ENCOUNTER — Telehealth: Payer: Self-pay | Admitting: *Deleted

## 2017-10-14 NOTE — Telephone Encounter (Signed)
Call back: Patient reports she is much improved. Advised her UCX was not sent to the lab. Encouraged to complete course of antibiotics.

## 2017-10-22 LAB — URINE CULTURE
MICRO NUMBER:: 81196455
SPECIMEN QUALITY:: ADEQUATE

## 2017-10-22 LAB — POST ANALYTICAL NAME DISCREPANCY

## 2017-10-23 ENCOUNTER — Encounter: Payer: Self-pay | Admitting: Family Medicine

## 2017-10-23 ENCOUNTER — Ambulatory Visit (INDEPENDENT_AMBULATORY_CARE_PROVIDER_SITE_OTHER): Payer: Medicare HMO | Admitting: Family Medicine

## 2017-10-23 ENCOUNTER — Other Ambulatory Visit: Payer: Self-pay | Admitting: Family Medicine

## 2017-10-23 VITALS — BP 127/72 | HR 73 | Temp 97.7°F | Ht 61.0 in | Wt 140.0 lb

## 2017-10-23 DIAGNOSIS — R319 Hematuria, unspecified: Secondary | ICD-10-CM

## 2017-10-23 DIAGNOSIS — R21 Rash and other nonspecific skin eruption: Secondary | ICD-10-CM | POA: Diagnosis not present

## 2017-10-23 DIAGNOSIS — R300 Dysuria: Secondary | ICD-10-CM

## 2017-10-23 LAB — POCT URINALYSIS DIPSTICK
Bilirubin, UA: NEGATIVE
GLUCOSE UA: NEGATIVE
Ketones, UA: NEGATIVE
LEUKOCYTES UA: NEGATIVE
Nitrite, UA: NEGATIVE
Protein, UA: NEGATIVE
UROBILINOGEN UA: 0.2 U/dL
pH, UA: 7 (ref 5.0–8.0)

## 2017-10-23 MED ORDER — TRIAMCINOLONE ACETONIDE 0.1 % EX CREA
1.0000 | TOPICAL_CREAM | Freq: Two times a day (BID) | CUTANEOUS | 0 refills | Status: DC
Start: 2017-10-23 — End: 2018-02-12

## 2017-10-23 MED ORDER — MUPIROCIN 2 % EX OINT: TOPICAL_OINTMENT | Freq: Two times a day (BID) | CUTANEOUS | 0 refills | Status: DC

## 2017-10-23 MED ORDER — HYDROCORTISONE VALERATE 0.2 % EX OINT
1.0000 | TOPICAL_OINTMENT | Freq: Every day | CUTANEOUS | 0 refills | Status: DC | PRN
Start: 2017-10-23 — End: 2018-02-12

## 2017-10-23 MED ORDER — TRIAMCINOLONE ACETONIDE 0.1 % EX CREA: 1.0000 "application " | TOPICAL_CREAM | Freq: Two times a day (BID) | CUTANEOUS | 0 refills | Status: DC
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
## 2017-10-23 NOTE — Progress Notes (Signed)
Changed steroid cream bc Westcort ointment unavailable.

## 2017-10-23 NOTE — Patient Instructions (Signed)
We will call you with the culture results on Monday.

## 2017-10-24 LAB — URINE CULTURE
MICRO NUMBER:: 81202796
Result:: NO GROWTH
SPECIMEN QUALITY: ADEQUATE

## 2017-12-29 DIAGNOSIS — H269 Unspecified cataract: Secondary | ICD-10-CM

## 2017-12-29 HISTORY — DX: Unspecified cataract: H26.9

## 2018-01-27 DIAGNOSIS — H25013 Cortical age-related cataract, bilateral: Secondary | ICD-10-CM | POA: Diagnosis not present

## 2018-01-27 DIAGNOSIS — H53483 Generalized contraction of visual field, bilateral: Secondary | ICD-10-CM | POA: Diagnosis not present

## 2018-01-27 DIAGNOSIS — H04123 Dry eye syndrome of bilateral lacrimal glands: Secondary | ICD-10-CM | POA: Diagnosis not present

## 2018-01-29 DIAGNOSIS — H40003 Preglaucoma, unspecified, bilateral: Secondary | ICD-10-CM | POA: Diagnosis not present

## 2018-01-29 DIAGNOSIS — H25813 Combined forms of age-related cataract, bilateral: Secondary | ICD-10-CM | POA: Diagnosis not present

## 2018-01-29 DIAGNOSIS — H43813 Vitreous degeneration, bilateral: Secondary | ICD-10-CM | POA: Diagnosis not present

## 2018-01-29 DIAGNOSIS — H52223 Regular astigmatism, bilateral: Secondary | ICD-10-CM | POA: Diagnosis not present

## 2018-01-29 DIAGNOSIS — H527 Unspecified disorder of refraction: Secondary | ICD-10-CM | POA: Diagnosis not present

## 2018-02-12 ENCOUNTER — Ambulatory Visit (INDEPENDENT_AMBULATORY_CARE_PROVIDER_SITE_OTHER): Payer: Medicare HMO | Admitting: Sports Medicine

## 2018-02-12 ENCOUNTER — Encounter: Payer: Self-pay | Admitting: Sports Medicine

## 2018-02-12 ENCOUNTER — Ambulatory Visit (INDEPENDENT_AMBULATORY_CARE_PROVIDER_SITE_OTHER): Payer: Medicare HMO

## 2018-02-12 VITALS — BP 121/82 | HR 108 | Temp 98.8°F

## 2018-02-12 DIAGNOSIS — R6889 Other general symptoms and signs: Secondary | ICD-10-CM

## 2018-02-12 DIAGNOSIS — R69 Illness, unspecified: Secondary | ICD-10-CM

## 2018-02-12 DIAGNOSIS — J441 Chronic obstructive pulmonary disease with (acute) exacerbation: Secondary | ICD-10-CM | POA: Diagnosis not present

## 2018-02-12 DIAGNOSIS — J111 Influenza due to unidentified influenza virus with other respiratory manifestations: Secondary | ICD-10-CM | POA: Insufficient documentation

## 2018-02-12 MED ORDER — PREDNISONE 50 MG PO TABS: 50.0000 mg | ORAL_TABLET | Freq: Every day | ORAL | 0 refills | Status: DC

## 2018-02-12 MED ORDER — DOXYCYCLINE HYCLATE 100 MG PO TABS: 100.0000 mg | ORAL_TABLET | Freq: Two times a day (BID) | ORAL | 0 refills | Status: AC

## 2018-02-12 NOTE — Progress Notes (Signed)
Pt informed me that she will be having glaucoma surgery on March 02, 2018 with [facility]. Her pre-op is 2/19.Elouise Munroe, [place]

## 2018-02-12 NOTE — Assessment & Plan Note (Signed)
Refuses flu swab, refuses prescription for Tamiflu. She states she might have some at home and I have advised her to take this. She told me that her son who is a paramedic told her that Tamiflu has more bad outcomes than good. Risks, benefits explained.

## 2018-02-12 NOTE — Assessment & Plan Note (Signed)
Doxycycline, prednisone. Moving poor air, right base coarse sounds, adding a chest x-ray as well. Return if no better in a week.
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
## 2018-02-23 DIAGNOSIS — H52223 Regular astigmatism, bilateral: Secondary | ICD-10-CM | POA: Diagnosis not present

## 2018-02-23 DIAGNOSIS — H25813 Combined forms of age-related cataract, bilateral: Secondary | ICD-10-CM | POA: Diagnosis not present

## 2018-03-02 DIAGNOSIS — J449 Chronic obstructive pulmonary disease, unspecified: Secondary | ICD-10-CM | POA: Diagnosis not present

## 2018-03-02 DIAGNOSIS — F419 Anxiety disorder, unspecified: Secondary | ICD-10-CM | POA: Diagnosis not present

## 2018-03-02 DIAGNOSIS — Z79899 Other long term (current) drug therapy: Secondary | ICD-10-CM | POA: Diagnosis not present

## 2018-03-02 DIAGNOSIS — Z87891 Personal history of nicotine dependence: Secondary | ICD-10-CM | POA: Diagnosis not present

## 2018-03-02 DIAGNOSIS — Z888 Allergy status to other drugs, medicaments and biological substances status: Secondary | ICD-10-CM | POA: Diagnosis not present

## 2018-03-02 DIAGNOSIS — Z881 Allergy status to other antibiotic agents status: Secondary | ICD-10-CM | POA: Diagnosis not present

## 2018-03-02 DIAGNOSIS — H25811 Combined forms of age-related cataract, right eye: Secondary | ICD-10-CM | POA: Diagnosis not present

## 2018-03-02 DIAGNOSIS — E039 Hypothyroidism, unspecified: Secondary | ICD-10-CM | POA: Diagnosis not present

## 2018-03-03 DIAGNOSIS — Z961 Presence of intraocular lens: Secondary | ICD-10-CM | POA: Diagnosis not present

## 2018-03-03 DIAGNOSIS — H43813 Vitreous degeneration, bilateral: Secondary | ICD-10-CM | POA: Diagnosis not present

## 2018-03-03 DIAGNOSIS — H52223 Regular astigmatism, bilateral: Secondary | ICD-10-CM | POA: Diagnosis not present

## 2018-03-03 DIAGNOSIS — H527 Unspecified disorder of refraction: Secondary | ICD-10-CM | POA: Diagnosis not present

## 2018-03-03 DIAGNOSIS — H25812 Combined forms of age-related cataract, left eye: Secondary | ICD-10-CM | POA: Diagnosis not present

## 2018-03-03 DIAGNOSIS — H40003 Preglaucoma, unspecified, bilateral: Secondary | ICD-10-CM | POA: Diagnosis not present

## 2018-03-30 DIAGNOSIS — H52223 Regular astigmatism, bilateral: Secondary | ICD-10-CM | POA: Diagnosis not present

## 2018-03-30 DIAGNOSIS — J449 Chronic obstructive pulmonary disease, unspecified: Secondary | ICD-10-CM | POA: Diagnosis not present

## 2018-03-30 DIAGNOSIS — H25812 Combined forms of age-related cataract, left eye: Secondary | ICD-10-CM | POA: Diagnosis not present

## 2018-03-30 DIAGNOSIS — E079 Disorder of thyroid, unspecified: Secondary | ICD-10-CM | POA: Diagnosis not present

## 2018-03-30 DIAGNOSIS — Z961 Presence of intraocular lens: Secondary | ICD-10-CM | POA: Diagnosis not present

## 2018-03-30 DIAGNOSIS — H43813 Vitreous degeneration, bilateral: Secondary | ICD-10-CM | POA: Diagnosis not present

## 2018-03-30 DIAGNOSIS — Z9841 Cataract extraction status, right eye: Secondary | ICD-10-CM | POA: Diagnosis not present

## 2018-03-30 DIAGNOSIS — F419 Anxiety disorder, unspecified: Secondary | ICD-10-CM | POA: Diagnosis not present

## 2018-03-30 DIAGNOSIS — H40003 Preglaucoma, unspecified, bilateral: Secondary | ICD-10-CM | POA: Diagnosis not present

## 2018-03-31 ENCOUNTER — Encounter: Payer: Self-pay | Admitting: Family Medicine

## 2018-03-31 DIAGNOSIS — Z961 Presence of intraocular lens: Secondary | ICD-10-CM | POA: Diagnosis not present

## 2018-03-31 DIAGNOSIS — H52223 Regular astigmatism, bilateral: Secondary | ICD-10-CM | POA: Diagnosis not present

## 2018-03-31 DIAGNOSIS — H40003 Preglaucoma, unspecified, bilateral: Secondary | ICD-10-CM | POA: Diagnosis not present

## 2018-03-31 DIAGNOSIS — H25812 Combined forms of age-related cataract, left eye: Secondary | ICD-10-CM | POA: Diagnosis not present

## 2018-03-31 DIAGNOSIS — H527 Unspecified disorder of refraction: Secondary | ICD-10-CM | POA: Diagnosis not present

## 2018-03-31 DIAGNOSIS — H43813 Vitreous degeneration, bilateral: Secondary | ICD-10-CM | POA: Diagnosis not present

## 2018-04-22 DIAGNOSIS — H40003 Preglaucoma, unspecified, bilateral: Secondary | ICD-10-CM | POA: Diagnosis not present

## 2018-04-22 DIAGNOSIS — H527 Unspecified disorder of refraction: Secondary | ICD-10-CM | POA: Diagnosis not present

## 2018-04-22 DIAGNOSIS — H43811 Vitreous degeneration, right eye: Secondary | ICD-10-CM | POA: Diagnosis not present

## 2018-04-22 DIAGNOSIS — H52223 Regular astigmatism, bilateral: Secondary | ICD-10-CM | POA: Diagnosis not present

## 2018-04-22 DIAGNOSIS — Z961 Presence of intraocular lens: Secondary | ICD-10-CM | POA: Diagnosis not present

## 2018-04-23 DIAGNOSIS — Z961 Presence of intraocular lens: Secondary | ICD-10-CM | POA: Diagnosis not present

## 2018-05-03 ENCOUNTER — Other Ambulatory Visit: Payer: Self-pay | Admitting: *Deleted

## 2018-05-03 MED ORDER — ALPRAZOLAM 2 MG PO TABS: 2.0000 mg | ORAL_TABLET | Freq: Every evening | ORAL | 0 refills | Status: DC | PRN

## 2018-05-03 NOTE — Telephone Encounter (Signed)
Sent to pcp for signature.Ann Guerrero, [place]

## 2018-06-28 ENCOUNTER — Telehealth: Payer: Self-pay | Admitting: Family Medicine

## 2018-06-28 MED ORDER — ALPRAZOLAM 2 MG PO TABS: 2.0000 mg | ORAL_TABLET | Freq: Every evening | ORAL | 0 refills | Status: DC | PRN

## 2018-06-28 NOTE — Telephone Encounter (Signed)
Patient calls and wants to let you know that [facility] will no longer fill a 90 day supply of the Xanax.  Would like you to send this to [facility] in [place]. KG LPN

## 2018-06-28 NOTE — Telephone Encounter (Signed)
rx sent for 30 days supply

## 2018-08-19 ENCOUNTER — Other Ambulatory Visit: Payer: Self-pay | Admitting: Family Medicine

## 2018-08-24 ENCOUNTER — Other Ambulatory Visit: Payer: Self-pay | Admitting: *Deleted

## 2018-08-24 NOTE — Telephone Encounter (Signed)
Sent to pcp for signature/ error, this was sent 08/19/18 #30 with 1 refill.Ann Guerrero, Ann Guerrero
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
## 2018-10-04 ENCOUNTER — Ambulatory Visit (INDEPENDENT_AMBULATORY_CARE_PROVIDER_SITE_OTHER): Payer: Medicare HMO | Admitting: *Deleted

## 2018-10-04 DIAGNOSIS — Z0000 Encounter for general adult medical examination without abnormal findings: Secondary | ICD-10-CM | POA: Diagnosis not present

## 2018-10-04 NOTE — Patient Instructions (Signed)
Please schedule your next medicare wellness visit with me in 1 yr.  Ann Guerrero , Thank you for taking time to come for yourMedicare Wellness Visit. I appreciate your ongoing commitment to your health goals. Please review the following plan we discussed and let me know if I can assist you in the future.  Continue doing brain stimulating activities (puzzles, reading, adult coloring books, staying active) to keep memory sharp.  Start back exercising like we talked about, riding the stationary bike will be good for your knees. Please schedule your next medicare wellness visit with me in 1 yr.  Goals             . Exercise 3x per week (30 min per time)       Try to get back into your exercise routine. Try walking 3 x a week for at least 30 minutes a day.     Health Maintenance for Postmenopausal Women Menopause is a normal process in which your reproductive ability comes to an end. This process happens gradually over a span of months to years, usually between the ages of 46 and 66. Menopause is complete when you have missed 12 consecutive menstrual periods. It is important to talk with your health care provider about some of the most common conditions that affect postmenopausal women, such as heart disease, cancer, and bone loss (osteoporosis). Adopting a healthy lifestyle and getting preventive care can help to promote your health and wellness. Those actions can also lower your chances of developing some of these common conditions. What should I know about menopause? During menopause, you may experience a number of symptoms, such as:  Moderate-to-severe hot flashes.  Night sweats.  Decrease in sex drive.  Mood swings.  Headaches.  Tiredness.  Irritability.  Memory problems.  Insomnia.  Choosing to treat or not to treat menopausal changes is an individual decision that you make with your health care provider. What should I know about hormone replacement therapy and  supplements? Hormone therapy products are effective for treating symptoms that are associated with menopause, such as hot flashes and night sweats. Hormone replacement carries certain risks, especially as you become older. If you are thinking about using estrogen or estrogen with progestin treatments, discuss the benefits and risks with your health care provider. What should I know about heart disease and stroke? Heart disease, heart attack, and stroke become more likely as you age. This may be due, in part, to the hormonal changes that your body experiences during menopause. These can affect how your body processes dietary fats, triglycerides, and cholesterol. Heart attack and stroke are both medical emergencies. There are many things that you can do to help prevent heart disease and stroke:  Have your blood pressure checked at least every 1-2 years. High blood pressure causes heart disease and increases the risk of stroke.  If you are 58-84 years old, ask your health care provider if you should take aspirin to prevent a heart attack or a stroke.  Do not use any tobacco products, including cigarettes, chewing tobacco, or electronic cigarettes. If you need help quitting, ask your health care provider.  It is important to eat a healthy diet and maintain a healthy weight. ? Be sure to include plenty of vegetables, fruits, low-fat dairy products, and lean protein. ? Avoid eating foods that are high in solid fats, added sugars, or salt (sodium).  Get regular exercise. This is one of the most important things that you can do for your health. ?  Try to exercise for at least 150 minutes each week. The type of exercise that you do should increase your heart rate and make you sweat. This is known as moderate-intensity exercise. ? Try to do strengthening exercises at least twice each week. Do these in addition to the moderate-intensity exercise.  Know your numbers.Ask your health care provider to check  your cholesterol and your blood glucose. Continue to have your blood tested as directed by your health care provider.  What should I know about cancer screening? There are several types of cancer. Take the following steps to reduce your risk and to catch any cancer development as early as possible. Breast Cancer  Practice breast self-awareness. ? This means understanding how your breasts normally appear and feel. ? It also means doing regular breast self-exams. Let your health care provider know about any changes, no matter how small.  If you are 30 or older, have a clinician do a breast exam (clinical breast exam or CBE) every year. Depending on your age, family history, and medical history, it may be recommended that you also have a yearly breast X-ray (mammogram).  If you have a family history of breast cancer, talk with your health care provider about genetic screening.  If you are at high risk for breast cancer, talk with your health care provider about having an MRI and a mammogram every year.  Breast cancer (BRCA) gene test is recommended for women who have family members with BRCA-related cancers. Results of the assessment will determine the need for genetic counseling and BRCA1 and for BRCA2 testing. BRCA-related cancers include these types: ? Breast. This occurs in males or females. ? Ovarian. ? Tubal. This may also be called fallopian tube cancer. ? Cancer of the abdominal or pelvic lining (peritoneal cancer). ? Prostate. ? Pancreatic.  Cervical, Uterine, and Ovarian Cancer Your health care provider may recommend that you be screened regularly for cancer of the pelvic organs. These include your ovaries, uterus, and vagina. This screening involves a pelvic exam, which includes checking for microscopic changes to the surface of your cervix (Pap test).  For women ages 21-65, health care providers may recommend a pelvic exam and a Pap test every three years. For women ages 24-65,  they may recommend the Pap test and pelvic exam, combined with testing for human papilloma virus (HPV), every five years. Some types of HPV increase your risk of cervical cancer. Testing for HPV may also be done on women of any age who have unclear Pap test results.  Other health care providers may not recommend any screening for nonpregnant women who are considered low risk for pelvic cancer and have no symptoms. Ask your health care provider if a screening pelvic exam is right for you.  If you have had past treatment for cervical cancer or a condition that could lead to cancer, you need Pap tests and screening for cancer for at least 20 years after your treatment. If Pap tests have been discontinued for you, your risk factors (such as having a new sexual partner) need to be reassessed to determine if you should start having screenings again. Some women have medical problems that increase the chance of getting cervical cancer. In these cases, your health care provider may recommend that you have screening and Pap tests more often.  If you have a family history of uterine cancer or ovarian cancer, talk with your health care provider about genetic screening.  If you have vaginal bleeding after reaching menopause,  tell your health care provider.  There are currently no reliable tests available to screen for ovarian cancer.  Lung Cancer Lung cancer screening is recommended for adults 84-34 years old who are at high risk for lung cancer because of a history of smoking. A yearly low-dose CT scan of the lungs is recommended if you:  Currently smoke.  Have a history of at least 30 pack-years of smoking and you currently smoke or have quit within the past 15 years. A pack-year is smoking an average of one pack of cigarettes per day for one year.  Yearly screening should:  Continue until it has been 15 years since you quit.  Stop if you develop a health problem that would prevent you from having lung  cancer treatment.  Colorectal Cancer  This type of cancer can be detected and can often be prevented.  Routine colorectal cancer screening usually begins at age 56 and continues through age 67.  If you have risk factors for colon cancer, your health care provider may recommend that you be screened at an earlier age.  If you have a family history of colorectal cancer, talk with your health care provider about genetic screening.  Your health care provider may also recommend using home test kits to check for hidden blood in your stool.  A small camera at the end of a tube can be used to examine your colon directly (sigmoidoscopy or colonoscopy). This is done to check for the earliest forms of colorectal cancer.  Direct examination of the colon should be repeated every 5-10 years until age 78. However, if early forms of precancerous polyps or small growths are found or if you have a family history or genetic risk for colorectal cancer, you may need to be screened more often.  Skin Cancer  Check your skin from head to toe regularly.  Monitor any moles. Be sure to tell your health care provider: ? About any new moles or changes in moles, especially if there is a change in a mole's shape or color. ? If you have a mole that is larger than the size of a pencil eraser.  If any of your family members has a history of skin cancer, especially at a young age, talk with your health care provider about genetic screening.  Always use sunscreen. Apply sunscreen liberally and repeatedly throughout the day.  Whenever you are outside, protect yourself by wearing long sleeves, pants, a wide-brimmed hat, and sunglasses.  What should I know about osteoporosis? Osteoporosis is a condition in which bone destruction happens more quickly than new bone creation. After menopause, you may be at an increased risk for osteoporosis. To help prevent osteoporosis or the bone fractures that can happen because of  osteoporosis, the following is recommended:  If you are 10-34 years old, get at least 1,000 mg of calcium and at least 600 mg of vitamin D per day.  If you are older than age 62 but younger than age 21, get at least 1,200 mg of calcium and at least 600 mg of vitamin D per day.  If you are older than age 41, get at least 1,200 mg of calcium and at least 800 mg of vitamin D per day.  Smoking and excessive alcohol intake increase the risk of osteoporosis. Eat foods that are rich in calcium and vitamin D, and do weight-bearing exercises several times each week as directed by your health care provider. What should I know about how menopause affects my mental  health? Depression may occur at any age, but it is more common as you become older. Common symptoms of depression include:  Low or sad mood.  Changes in sleep patterns.  Changes in appetite or eating patterns.  Feeling an overall lack of motivation or enjoyment of activities that you previously enjoyed.  Frequent crying spells.  Talk with your health care provider if you think that you are experiencing depression. What should I know about immunizations? It is important that you get and maintain your immunizations. These include:  Tetanus, diphtheria, and pertussis (Tdap) booster vaccine.  Influenza every year before the flu season begins.  Pneumonia vaccine.  Shingles vaccine.  Your health care provider may also recommend other immunizations. This information is not intended to replace advice given to you by your health care provider. Make sure you discuss any questions you have with your health care provider. Document Released: 02/06/2006 Document Revised: 07/04/2016 Document Reviewed: 09/18/2015 Elsevier Interactive Patient Education  2018 [employer].

## 2018-10-15 ENCOUNTER — Telehealth: Payer: Self-pay | Admitting: Family Medicine

## 2018-10-15 DIAGNOSIS — J441 Chronic obstructive pulmonary disease with (acute) exacerbation: Secondary | ICD-10-CM

## 2018-10-15 DIAGNOSIS — R7301 Impaired fasting glucose: Secondary | ICD-10-CM

## 2018-10-15 DIAGNOSIS — Z0000 Encounter for general adult medical examination without abnormal findings: Secondary | ICD-10-CM

## 2018-10-15 DIAGNOSIS — E038 Other specified hypothyroidism: Secondary | ICD-10-CM

## 2018-10-15 NOTE — Telephone Encounter (Signed)
Ann Guerrero requested blood work to be sent in. She has a follow up on results on 11/04/18.  She stated, "I would like for my Thyroid,Iron, Vit D to be checked."  Please Advise.

## 2018-10-21 NOTE — Addendum Note (Signed)
Addended by: Huel Cote on: 10/21/2018 02:15 PM   Modules accepted: Orders

## 2018-10-25 NOTE — Telephone Encounter (Signed)
Pt advised.

## 2018-10-25 NOTE — Telephone Encounter (Signed)
Labs signed

## 2018-10-25 NOTE — Addendum Note (Signed)
Addended by: Beatrice Lecher D on: 10/25/2018 01:01 PM   Modules accepted: Orders

## 2018-11-02 DIAGNOSIS — R7301 Impaired fasting glucose: Secondary | ICD-10-CM | POA: Diagnosis not present

## 2018-11-02 DIAGNOSIS — E038 Other specified hypothyroidism: Secondary | ICD-10-CM | POA: Diagnosis not present

## 2018-11-02 DIAGNOSIS — Z0000 Encounter for general adult medical examination without abnormal findings: Secondary | ICD-10-CM | POA: Diagnosis not present

## 2018-11-02 DIAGNOSIS — J441 Chronic obstructive pulmonary disease with (acute) exacerbation: Secondary | ICD-10-CM | POA: Diagnosis not present

## 2018-11-04 ENCOUNTER — Ambulatory Visit (INDEPENDENT_AMBULATORY_CARE_PROVIDER_SITE_OTHER): Payer: Medicare HMO | Admitting: Family Medicine

## 2018-11-04 ENCOUNTER — Encounter: Payer: Self-pay | Admitting: Family Medicine

## 2018-11-04 VITALS — BP 109/62 | HR 64 | Ht 60.63 in | Wt 136.0 lb

## 2018-11-04 DIAGNOSIS — E038 Other specified hypothyroidism: Secondary | ICD-10-CM | POA: Diagnosis not present

## 2018-11-04 DIAGNOSIS — R7301 Impaired fasting glucose: Secondary | ICD-10-CM

## 2018-11-04 DIAGNOSIS — F5101 Primary insomnia: Secondary | ICD-10-CM | POA: Diagnosis not present

## 2018-11-04 LAB — COMPREHENSIVE METABOLIC PANEL
AG Ratio: 1.6 (calc) (ref 1.0–2.5)
ALBUMIN MSPROF: 4 g/dL (ref 3.6–5.1)
ALKALINE PHOSPHATASE (APISO): 61 U/L (ref 33–130)
ALT: 17 U/L (ref 6–29)
AST: 18 U/L (ref 10–35)
BUN: 16 mg/dL (ref 7–25)
CO2: 30 mmol/L (ref 20–32)
CREATININE: 0.83 mg/dL (ref 0.50–0.99)
Calcium: 9.3 mg/dL (ref 8.6–10.4)
Chloride: 103 mmol/L (ref 98–110)
GLUCOSE: 124 mg/dL (ref 65–139)
Globulin: 2.5 g/dL (calc) (ref 1.9–3.7)
POTASSIUM: 4.3 mmol/L (ref 3.5–5.3)
Sodium: 138 mmol/L (ref 135–146)
TOTAL PROTEIN: 6.5 g/dL (ref 6.1–8.1)
Total Bilirubin: 0.4 mg/dL (ref 0.2–1.2)

## 2018-11-04 LAB — CBC
HEMATOCRIT: 44.5 % (ref 35.0–45.0)
HEMOGLOBIN: 14.9 g/dL (ref 11.7–15.5)
MCH: 30.8 pg (ref 27.0–33.0)
MCHC: 33.5 g/dL (ref 32.0–36.0)
MCV: 91.9 fL (ref 80.0–100.0)
MPV: 10 fL (ref 7.5–12.5)
PLATELETS: 326 10*3/uL (ref 140–400)
RBC: 4.84 10*6/uL (ref 3.80–5.10)
RDW: 12.3 % (ref 11.0–15.0)
WBC: 7.1 10*3/uL (ref 3.8–10.8)

## 2018-11-04 LAB — TEST AUTHORIZATION

## 2018-11-04 LAB — LIPID PANEL
CHOL/HDL RATIO: 2.8 (calc) (ref <5.0)
Cholesterol: 170 mg/dL (ref <200)
HDL: 60 mg/dL (ref >50)
LDL CHOLESTEROL (CALC): 93 mg/dL
Non-HDL Cholesterol (Calc): 110 mg/dL (calc) (ref <130)
TRIGLYCERIDES: 82 mg/dL (ref <150)

## 2018-11-04 LAB — HEMOGLOBIN A1C
EAG (MMOL/L): 7.1 (calc)
HEMOGLOBIN A1C: 6.1 %{Hb} — AB (ref <5.7)
MEAN PLASMA GLUCOSE: 128 (calc)

## 2018-11-04 LAB — TSH: TSH: 1.67 m[IU]/L (ref 0.40–4.50)

## 2018-11-04 MED ORDER — ALPRAZOLAM 2 MG PO TABS: 2.0000 mg | ORAL_TABLET | Freq: Every evening | ORAL | 1 refills | Status: DC | PRN

## 2018-11-04 MED ORDER — LEVOTHYROXINE SODIUM 25 MCG PO TABS: ORAL_TABLET | ORAL | 3 refills | Status: DC

## 2018-11-04 NOTE — Progress Notes (Signed)
Subjective:    CC:   HPI:  Impaired fasting glucose-no increased thirst or urination. No symptoms consistent with hypoglycemia.  Hypothyroidism - Taking medication regularly in the AM away from food and vitamins, etc. No recent change to skin, hair, or energy levels.  Waking up with sweats at night.    Has a history of hemochromatosis and just wants to keep an eye on her iron levels and make sure that they are not going too high.  Insomnia-she uses Xanax at bedtime.  She would like refills on her medication.  She tolerates it well without any side effects or problems.  Past medical history, Surgical history, Family history not pertinant except as noted below, Social history, Allergies, and medications have been entered into the medical record, reviewed, and corrections made.   Review of Systems: No fevers, chills, night sweats, weight loss, chest pain, or shortness of breath.   Objective:    General: Well Developed, well nourished, and in no acute distress.  Neuro: Alert and oriented x3, extra-ocular muscles intact, sensation grossly intact.  HEENT: Normocephalic, atraumatic  Skin: Warm and dry, no rashes. Cardiac: Regular rate and rhythm, no murmurs rubs or gallops, no lower extremity edema.  Respiratory: Clear to auscultation bilaterally. Not using accessory muscles, speaking in full sentences.   Impression and Recommendations:    Hypothyroidism -TSH looks great.  Well controlled.  Refill medication for 1 year.  IFG - Well controlled. Continue current regimen. Follow up in  6 mo. continue to keep up on her healthy diet and regular exercise.  Hemochromatosis-we will call the lab to see if we can add on iron levels.  If not then we can always drawn at the next time.  Insomnia-refilled Xanax for 6 months.

## 2019-05-24 ENCOUNTER — Other Ambulatory Visit: Payer: Self-pay | Admitting: Family Medicine

## 2019-05-24 DIAGNOSIS — F5101 Primary insomnia: Secondary | ICD-10-CM

## 2019-07-05 ENCOUNTER — Other Ambulatory Visit: Payer: Self-pay | Admitting: Family Medicine

## 2019-07-05 DIAGNOSIS — F5101 Primary insomnia: Secondary | ICD-10-CM

## 2019-07-19 ENCOUNTER — Ambulatory Visit: Payer: Medicare HMO | Admitting: Family Medicine
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
## 2019-07-25 ENCOUNTER — Ambulatory Visit: Payer: Medicare HMO | Admitting: Family Medicine

## 2019-08-10 ENCOUNTER — Other Ambulatory Visit: Payer: Self-pay | Admitting: Family Medicine

## 2019-08-10 DIAGNOSIS — F5101 Primary insomnia: Secondary | ICD-10-CM

## 2019-08-10 NOTE — Telephone Encounter (Signed)
Patient has an appointment with Maudie Mercury in October. Does she need one with you? Please advise.

## 2019-08-10 NOTE — Telephone Encounter (Signed)
I agree lets see if we can do an appt with me soon, maybe sept?

## 2019-08-11 NOTE — Telephone Encounter (Signed)
Patient has a follow up on 08/29/2019. She reports that she had tried to come in previously for headaches and was told she was not able to come in the office. PCP is aware.

## 2019-08-29 ENCOUNTER — Ambulatory Visit (INDEPENDENT_AMBULATORY_CARE_PROVIDER_SITE_OTHER): Payer: Medicare HMO | Admitting: Family Medicine

## 2019-08-29 ENCOUNTER — Other Ambulatory Visit: Payer: Self-pay

## 2019-08-29 ENCOUNTER — Encounter: Payer: Self-pay | Admitting: Family Medicine

## 2019-08-29 VITALS — BP 122/68 | HR 62 | Ht 61.0 in | Wt 140.0 lb

## 2019-08-29 DIAGNOSIS — F5101 Primary insomnia: Secondary | ICD-10-CM

## 2019-08-29 DIAGNOSIS — R7301 Impaired fasting glucose: Secondary | ICD-10-CM

## 2019-08-29 DIAGNOSIS — E038 Other specified hypothyroidism: Secondary | ICD-10-CM

## 2019-08-29 DIAGNOSIS — I700 Atherosclerosis of aorta: Secondary | ICD-10-CM | POA: Diagnosis not present

## 2019-08-29 DIAGNOSIS — E559 Vitamin D deficiency, unspecified: Secondary | ICD-10-CM | POA: Diagnosis not present

## 2019-08-29 DIAGNOSIS — L309 Dermatitis, unspecified: Secondary | ICD-10-CM

## 2019-08-29 LAB — POCT GLYCOSYLATED HEMOGLOBIN (HGB A1C): Hemoglobin A1C: 6 % — AB (ref 4.0–5.6)

## 2019-08-29 LAB — VITAMIN D 25 HYDROXY (VIT D DEFICIENCY, FRACTURES): Vit D, 25-Hydroxy: 45 ng/mL (ref 30–100)

## 2019-08-29 LAB — TSH: TSH: 3.79 mIU/L (ref 0.40–4.50)

## 2019-08-29 MED ORDER — TRIAMCINOLONE ACETONIDE 0.1 % EX CREA: 1.0000 "application " | TOPICAL_CREAM | Freq: Two times a day (BID) | CUTANEOUS | 0 refills | Status: DC

## 2019-08-29 MED ORDER — ALPRAZOLAM 2 MG PO TABS: 2.0000 mg | ORAL_TABLET | Freq: Every evening | ORAL | 5 refills | Status: DC | PRN
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
## 2019-08-29 NOTE — Addendum Note (Signed)
Addended by: Beatrice Lecher D on: 08/29/2019 09:34 AM   Modules accepted: Orders

## 2019-08-29 NOTE — Assessment & Plan Note (Signed)
Well controlled. Continue current regimen. Follow up in  6 mo  

## 2019-08-29 NOTE — Assessment & Plan Note (Signed)
Due to recheck TSH. 

## 2019-08-29 NOTE — Assessment & Plan Note (Signed)
Recheck Vitamin D today. 

## 2019-08-29 NOTE — Assessment & Plan Note (Signed)
Not currently on a statin. Due to recheck later this week.

## 2019-08-29 NOTE — Assessment & Plan Note (Signed)
Uses her xanax most nights.  OK to refill for 6 months.

## 2019-09-30 ENCOUNTER — Other Ambulatory Visit: Payer: Self-pay

## 2019-09-30 MED ORDER — LEVOTHYROXINE SODIUM 25 MCG PO TABS: ORAL_TABLET | ORAL | 2 refills | Status: DC

## 2019-09-30 NOTE — Progress Notes (Signed)
Patient was in the office with spouse and requested that her Levothyroxine  be sent to [facility] and she would not be using [facility] any more due to cost. I re-sent since patient had labs completed in August and she was stable per PCP.

## 2019-10-10 ENCOUNTER — Ambulatory Visit: Payer: Medicare HMO

## 2019-10-25 ENCOUNTER — Telehealth: Payer: Self-pay | Admitting: Family Medicine

## 2019-10-25 NOTE — Telephone Encounter (Signed)
Can she drop off form so I can see what needs to be filled out  Probably can just schedule the Medicare Wellness but I really need to see the form first.

## 2019-10-25 NOTE — Telephone Encounter (Signed)
Patient calling in stating that she needs her Medical evaluation division of social services form completed to get her foster care licence. Wanted to make sure does this need to be with PCP or can she schedule a medicare wellness visit. She states husband, [name], had an appointment with Maudie Mercury already and that she was going to bring his form to be completed. Please advise.

## 2019-10-26 NOTE — Telephone Encounter (Signed)
Let patient know to bring forms in as requested. Patient agreed and stated husband, [name], will drop them off today. No further questions at this time.

## 2020-01-09 ENCOUNTER — Telehealth: Payer: Self-pay | Admitting: Family Medicine

## 2020-01-09 NOTE — Telephone Encounter (Signed)
Patient called and wanted to know about covid testing and I called her back. She does not want testing at this time. She will call back later if she wants something or if she has been exposed.

## 2020-02-27 ENCOUNTER — Ambulatory Visit: Payer: Medicare Other | Admitting: Family Medicine
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
## 2020-02-29 ENCOUNTER — Other Ambulatory Visit: Payer: Self-pay | Admitting: Family Medicine

## 2020-02-29 DIAGNOSIS — F5101 Primary insomnia: Secondary | ICD-10-CM

## 2020-05-04 ENCOUNTER — Telehealth: Payer: Self-pay | Admitting: Family Medicine

## 2020-05-04 NOTE — Telephone Encounter (Signed)
Pt advised and she will come in and p/u cards

## 2020-05-04 NOTE — Telephone Encounter (Signed)
Please call pt and remind her due for colon cancer screening. Would he like to do the stool cards again? If no, then please mark decline.

## 2020-07-09 ENCOUNTER — Telehealth: Payer: Self-pay | Admitting: Family Medicine

## 2020-07-09 DIAGNOSIS — F5101 Primary insomnia: Secondary | ICD-10-CM

## 2020-07-13 ENCOUNTER — Telehealth: Payer: Self-pay | Admitting: Family Medicine

## 2020-07-13 NOTE — Telephone Encounter (Signed)
error 

## 2020-07-18 NOTE — Telephone Encounter (Signed)
Patient called and she is aware that she needs an appointment to be seen for he Xanax. [name] wanting to know if she could do a virtual appointment to get more of her Xanax or if she could get an extension. She reports that she has custody of her 32 month old great grand-daughter. [name] having to go to hearings 2 times a week and does not have time to come in at this moment for a visit with you. Do you have any other recommendations? I did try to make her an appointment and she declined since she wants to hear from you. Please advise.

## 2020-07-18 NOTE — Telephone Encounter (Signed)
Patient is agreeable to a virtual visit and has been set up per patient request. -HSM. No other questions.

## 2020-07-18 NOTE — Telephone Encounter (Signed)
Okay for virtual appointment.

## 2020-07-23 ENCOUNTER — Telehealth (INDEPENDENT_AMBULATORY_CARE_PROVIDER_SITE_OTHER): Payer: Medicare Other | Admitting: Family Medicine

## 2020-07-23 VITALS — BP 118/71 | HR 62 | Ht 61.0 in | Wt 150.0 lb

## 2020-07-23 DIAGNOSIS — F5101 Primary insomnia: Secondary | ICD-10-CM | POA: Diagnosis not present

## 2020-07-23 DIAGNOSIS — R7989 Other specified abnormal findings of blood chemistry: Secondary | ICD-10-CM | POA: Diagnosis not present

## 2020-07-23 DIAGNOSIS — R7301 Impaired fasting glucose: Secondary | ICD-10-CM | POA: Diagnosis not present

## 2020-07-23 DIAGNOSIS — E042 Nontoxic multinodular goiter: Secondary | ICD-10-CM

## 2020-07-23 DIAGNOSIS — I700 Atherosclerosis of aorta: Secondary | ICD-10-CM | POA: Diagnosis not present

## 2020-07-23 DIAGNOSIS — F411 Generalized anxiety disorder: Secondary | ICD-10-CM

## 2020-07-23 MED ORDER — ALPRAZOLAM 2 MG PO TABS: 2.0000 mg | ORAL_TABLET | Freq: Every evening | ORAL | 5 refills | Status: DC | PRN

## 2020-07-23 NOTE — Assessment & Plan Note (Signed)
We will go ahead and refill alprazolam which she uses for anxiety and sleep.  Just continue to work on good sleep hygiene.

## 2020-07-23 NOTE — Assessment & Plan Note (Signed)
Rest levels have been really high over this last year and there still a lot going on within the family.

## 2020-07-23 NOTE — Progress Notes (Signed)
Following up for Xanax refills [id number] completed.  Has a lot going on with raising a grandchild and great grandchild. She did stop taking thyroid medication. Hasn't been on in over a year and feels fine. She states last labs were done when she was off medication.

## 2020-07-23 NOTE — Assessment & Plan Note (Signed)
Due to recheck TSH. 

## 2020-07-23 NOTE — Assessment & Plan Note (Addendum)
Due for A1C.  Get updated blood work.  [name] a little overdue for her A1c.  Lab Results  Component Value Date   HGBA1C 6.0 (A) 08/29/2019

## 2020-07-23 NOTE — Progress Notes (Signed)
Virtual Visit via Video Note  I connected with Ann Guerrero on 07/23/20 at  2:40 PM EDT by a video enabled telemedicine application and verified that I am speaking with the correct person using two identifiers.   I discussed the limitations of evaluation and management by telemedicine and the availability of in person appointments. The patient expressed understanding and agreed to proceed.  Patient location: at home Provider location: in office  Subjective:    CC: F/U Mood  HPI:  F/U aortic atherosclerosis - no recent CP or SOB.    Due to recheck thyroid.  Off her thyroid medication x 1 year. No longer getting Sore throats and no cold intolerance.  She actually feels like she is doing well without it she was previously on 25 mcg daily she does have a history of multinodular goiter  Impaired fasting glucose-no increased thirst or urination. No symptoms consistent with hypoglycemia.  Has been under a lot of stress this past year she has had a lot going on she has now taking care of her great grandchild.  Who is now 15 months they have been taking care of her since she was 6 months last October.  There is been a lot going on with custody.  And her granddaughter who used to live with them is into drugs now.  So this is been really upsetting to him difficult.  She does use her alprazolam mostly at night to help her sleep and she is requesting a refill today.  Past medical history, Surgical history, Family history not pertinant except as noted below, Social history, Allergies, and medications have been entered into the medical record, reviewed, and corrections made.   Review of Systems: No fevers, chills, night sweats, weight loss, chest pain, or shortness of breath.   Objective:    General: Speaking clearly in complete sentences without any shortness of breath.  Alert and oriented x3.  Normal judgment. No apparent acute distress.    Impression and Recommendations:    IFG (impaired  fasting glucose) Due for A1C.  Get updated blood work.  [name] a little overdue for her A1c.  Lab Results  Component Value Date   HGBA1C 6.0 (A) 08/29/2019     GOITER, MULTINODULAR Due to recheck TSH.   INSOMNIA We will go ahead and refill alprazolam which she uses for anxiety and sleep.  Just continue to work on good sleep hygiene.  Anxiety state Rest levels have been really high over this last year and there still a lot going on within the family.    Time spent in encounter 20 minutes  I discussed the assessment and treatment plan with the patient. The patient was provided an opportunity to ask questions and all were answered. The patient agreed with the plan and demonstrated an understanding of the instructions.   The patient was advised to call back or seek an in-person evaluation if the symptoms worsen or if the condition fails to improve as anticipated.   Beatrice Lecher, MD

## 2021-02-14 ENCOUNTER — Other Ambulatory Visit: Payer: Self-pay | Admitting: Family Medicine

## 2021-02-14 DIAGNOSIS — F411 Generalized anxiety disorder: Secondary | ICD-10-CM

## 2021-02-14 DIAGNOSIS — F5101 Primary insomnia: Secondary | ICD-10-CM

## 2021-02-15 NOTE — Telephone Encounter (Signed)
Patient scheduled an apt on March 1st 2022 @320pm  & was wondering if she could have refills up until her appt date. AM

## 2021-02-15 NOTE — Telephone Encounter (Signed)
Please call pt and have her to schedule a f/u appt for thyroid,ifg and medication refills. She will need FASTING LABS(THESE WERE ORDERED 07/23/2020 AND HAVE EXPIRED)!

## 2021-02-26 ENCOUNTER — Ambulatory Visit (INDEPENDENT_AMBULATORY_CARE_PROVIDER_SITE_OTHER): Payer: Medicare HMO | Admitting: Family Medicine

## 2021-02-26 ENCOUNTER — Other Ambulatory Visit: Payer: Self-pay

## 2021-02-26 ENCOUNTER — Encounter: Payer: Self-pay | Admitting: Family Medicine

## 2021-02-26 VITALS — BP 120/69 | HR 72 | Ht 61.0 in | Wt 139.0 lb

## 2021-02-26 DIAGNOSIS — I700 Atherosclerosis of aorta: Secondary | ICD-10-CM | POA: Diagnosis not present

## 2021-02-26 DIAGNOSIS — F5101 Primary insomnia: Secondary | ICD-10-CM | POA: Diagnosis not present

## 2021-02-26 DIAGNOSIS — F411 Generalized anxiety disorder: Secondary | ICD-10-CM | POA: Diagnosis not present

## 2021-02-26 DIAGNOSIS — R7989 Other specified abnormal findings of blood chemistry: Secondary | ICD-10-CM | POA: Diagnosis not present

## 2021-02-26 DIAGNOSIS — F439 Reaction to severe stress, unspecified: Secondary | ICD-10-CM | POA: Diagnosis not present

## 2021-02-26 DIAGNOSIS — E559 Vitamin D deficiency, unspecified: Secondary | ICD-10-CM | POA: Diagnosis not present

## 2021-02-26 DIAGNOSIS — R7301 Impaired fasting glucose: Secondary | ICD-10-CM | POA: Diagnosis not present

## 2021-02-26 LAB — POCT GLYCOSYLATED HEMOGLOBIN (HGB A1C): Hemoglobin A1C: 5.9 % — AB (ref 4.0–5.6)

## 2021-02-26 MED ORDER — ALPRAZOLAM 2 MG PO TABS: 2.0000 mg | ORAL_TABLET | Freq: Every evening | ORAL | 0 refills | Status: DC | PRN
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
## 2021-02-26 NOTE — Assessment & Plan Note (Signed)
Just reminded her that it is not recommended to use benzodiazepines for chronic use for sleep.  [name] already tried [place] and Ambien in the past.  She had nightmares with Lunesta and felt like the Ambien just was not effective.  But reminded her of the long-term effects of using these medications and to really try to use it sparingly.  I did go ahead and refill it today.

## 2021-02-26 NOTE — Assessment & Plan Note (Signed)
Due to recheck vitamin D levels. 

## 2021-02-26 NOTE — Assessment & Plan Note (Signed)
   Lab Results  Component Value Date   HGBA1C 5.9 (A) 02/26/2021   Looks   OK today. F/U in 6 months.

## 2021-02-26 NOTE — Assessment & Plan Note (Signed)
Not on a statin and doesn't want to be on one.

## 2021-02-26 NOTE — Assessment & Plan Note (Addendum)
Plan to recheck iron levels she has not been getting any therapeutic blood draws over the last 2 more years.

## 2021-02-27 ENCOUNTER — Other Ambulatory Visit: Payer: Self-pay | Admitting: *Deleted

## 2021-02-27 DIAGNOSIS — D582 Other hemoglobinopathies: Secondary | ICD-10-CM

## 2021-02-27 LAB — CBC WITH DIFFERENTIAL/PLATELET
Absolute Monocytes: 593 cells/uL (ref 200–950)
Basophils Absolute: 84 cells/uL (ref 0–200)
Basophils Relative: 1.1 %
Eosinophils Absolute: 129 cells/uL (ref 15–500)
Eosinophils Relative: 1.7 %
HCT: 45.9 % — ABNORMAL HIGH (ref 35.0–45.0)
Hemoglobin: 16 g/dL — ABNORMAL HIGH (ref 11.7–15.5)
Lymphs Abs: 2630 cells/uL (ref 850–3900)
MCH: 32 pg (ref 27.0–33.0)
MCHC: 34.9 g/dL (ref 32.0–36.0)
MCV: 91.8 fL (ref 80.0–100.0)
MPV: 10 fL (ref 7.5–12.5)
Monocytes Relative: 7.8 %
Neutro Abs: 4165 cells/uL (ref 1500–7800)
Neutrophils Relative %: 54.8 %
Platelets: 302 10*3/uL (ref 140–400)
RBC: 5 10*6/uL (ref 3.80–5.10)
RDW: 12.3 % (ref 11.0–15.0)
Total Lymphocyte: 34.6 %
WBC: 7.6 10*3/uL (ref 3.8–10.8)

## 2021-02-27 LAB — COMPLETE METABOLIC PANEL WITH GFR
AG Ratio: 1.4 (calc) (ref 1.0–2.5)
ALT: 21 U/L (ref 6–29)
AST: 20 U/L (ref 10–35)
Albumin: 4.2 g/dL (ref 3.6–5.1)
Alkaline phosphatase (APISO): 78 U/L (ref 37–153)
BUN: 11 mg/dL (ref 7–25)
CO2: 33 mmol/L — ABNORMAL HIGH (ref 20–32)
Calcium: 9.6 mg/dL (ref 8.6–10.4)
Chloride: 101 mmol/L (ref 98–110)
Creat: 0.75 mg/dL (ref 0.50–0.99)
GFR, Est African American: 96 mL/min/{1.73_m2} (ref >=60)
GFR, Est Non African American: 83 mL/min/{1.73_m2} (ref >=60)
Globulin: 3 g/dL (calc) (ref 1.9–3.7)
Glucose, Bld: 92 mg/dL (ref 65–99)
Potassium: 4.8 mmol/L (ref 3.5–5.3)
Sodium: 139 mmol/L (ref 135–146)
Total Bilirubin: 0.8 mg/dL (ref 0.2–1.2)
Total Protein: 7.2 g/dL (ref 6.1–8.1)

## 2021-02-27 LAB — LIPID PANEL W/REFLEX DIRECT LDL
Cholesterol: 175 mg/dL (ref <200)
HDL: 63 mg/dL (ref >=50)
LDL Cholesterol (Calc): 92 mg/dL (calc)
Non-HDL Cholesterol (Calc): 112 mg/dL (calc) (ref <130)
Total CHOL/HDL Ratio: 2.8 (calc) (ref <5.0)
Triglycerides: 106 mg/dL (ref <150)

## 2021-02-27 LAB — TSH: TSH: 2.43 mIU/L (ref 0.40–4.50)

## 2021-02-27 LAB — IRON,TIBC AND FERRITIN PANEL
%SAT: 52 % (calc) — ABNORMAL HIGH (ref 16–45)
Ferritin: 168 ng/mL (ref 16–288)
Iron: 157 ug/dL (ref 45–160)
TIBC: 304 mcg/dL (calc) (ref 250–450)

## 2021-02-27 LAB — VITAMIN D 25 HYDROXY (VIT D DEFICIENCY, FRACTURES): Vit D, 25-Hydroxy: 36 ng/mL (ref 30–100)

## 2021-03-05 ENCOUNTER — Telehealth: Payer: Self-pay | Admitting: *Deleted

## 2021-03-05 NOTE — Telephone Encounter (Signed)
Per referral lvm of upcoming appts. - mailed welcome packet with calendar

## 2021-03-25 ENCOUNTER — Other Ambulatory Visit: Payer: Self-pay | Admitting: Family

## 2021-03-26 ENCOUNTER — Inpatient Hospital Stay: Payer: Medicare HMO | Attending: Family

## 2021-03-26 ENCOUNTER — Inpatient Hospital Stay: Payer: Medicare HMO | Admitting: Family

## 2021-03-26 ENCOUNTER — Encounter: Payer: Self-pay | Admitting: Family

## 2021-03-26 ENCOUNTER — Other Ambulatory Visit: Payer: Self-pay

## 2021-03-26 LAB — RETICULOCYTES
Immature Retic Fract: 8.9 % (ref 2.3–15.9)
RBC.: 4.83 MIL/uL (ref 3.87–5.11)
Retic Count, Absolute: 94.7 10*3/uL (ref 19.0–186.0)
Retic Ct Pct: 2 % (ref 0.4–3.1)

## 2021-03-26 LAB — CBC WITH DIFFERENTIAL (CANCER CENTER ONLY)
Abs Immature Granulocytes: 0.03 10*3/uL (ref 0.00–0.07)
Basophils Absolute: 0.1 10*3/uL (ref 0.0–0.1)
Basophils Relative: 1 %
Eosinophils Absolute: 0.2 10*3/uL (ref 0.0–0.5)
Eosinophils Relative: 3 %
HCT: 45.5 % (ref 36.0–46.0)
Hemoglobin: 15.1 g/dL — ABNORMAL HIGH (ref 12.0–15.0)
Immature Granulocytes: 0 %
Lymphocytes Relative: 29 %
Lymphs Abs: 2.3 10*3/uL (ref 0.7–4.0)
MCH: 31.2 pg (ref 26.0–34.0)
MCHC: 33.2 g/dL (ref 30.0–36.0)
MCV: 94 fL (ref 80.0–100.0)
Monocytes Absolute: 0.8 10*3/uL (ref 0.1–1.0)
Monocytes Relative: 10 %
Neutro Abs: 4.5 10*3/uL (ref 1.7–7.7)
Neutrophils Relative %: 57 %
Platelet Count: 277 10*3/uL (ref 150–400)
RBC: 4.84 MIL/uL (ref 3.87–5.11)
RDW: 12.9 % (ref 11.5–15.5)
WBC Count: 7.9 10*3/uL (ref 4.0–10.5)
nRBC: 0 % (ref 0.0–0.2)

## 2021-03-26 LAB — CMP (CANCER CENTER ONLY)
ALT: 17 U/L (ref 0–44)
AST: 19 U/L (ref 15–41)
Albumin: 4.2 g/dL (ref 3.5–5.0)
Alkaline Phosphatase: 68 U/L (ref 38–126)
Anion gap: 6 (ref 5–15)
BUN: 15 mg/dL (ref 8–23)
CO2: 32 mmol/L (ref 22–32)
Calcium: 10.1 mg/dL (ref 8.9–10.3)
Chloride: 104 mmol/L (ref 98–111)
Creatinine: 0.8 mg/dL (ref 0.44–1.00)
GFR, Estimated: >60 mL/min (ref >60)
Glucose, Bld: 118 mg/dL — ABNORMAL HIGH (ref 70–99)
Potassium: 5.4 mmol/L — ABNORMAL HIGH (ref 3.5–5.1)
Sodium: 142 mmol/L (ref 135–145)
Total Bilirubin: 0.7 mg/dL (ref 0.3–1.2)
Total Protein: 7.2 g/dL (ref 6.5–8.1)

## 2021-03-26 LAB — IRON AND TIBC
Iron: 150 ug/dL — ABNORMAL HIGH (ref 41–142)
Saturation Ratios: 51 % (ref 21–57)
TIBC: 296 ug/dL (ref 236–444)
UIBC: 146 ug/dL (ref 120–384)

## 2021-03-26 LAB — LACTATE DEHYDROGENASE: LDH: 146 U/L (ref 98–192)

## 2021-03-26 LAB — FERRITIN: Ferritin: 156 ng/mL (ref 11–307)

## 2021-03-26 LAB — SAVE SMEAR(SSMR), FOR PROVIDER SLIDE REVIEW
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
## 2021-03-27 ENCOUNTER — Telehealth: Payer: Self-pay

## 2021-03-27 NOTE — Telephone Encounter (Signed)
Pt called with phlebot and ivf appt per 3/30 sch message   Ann Guerrero

## 2021-04-04 ENCOUNTER — Other Ambulatory Visit: Payer: Self-pay

## 2021-04-04 ENCOUNTER — Inpatient Hospital Stay: Payer: Medicare HMO | Attending: Family

## 2021-04-04 MED ORDER — SODIUM CHLORIDE 0.9 % IV SOLN
Freq: Once | INTRAVENOUS | Status: AC
  Filled 2021-04-04: qty 250

## 2021-04-04 NOTE — Progress Notes (Signed)
Ann Guerrero presents today for phlebotomy per MD orders. Phlebotomy procedure started at 1140 and ended at 1210 501 grams removed via 20 gauge needle to left AC by mary garner,RN  Patient observed for 30 minutes after procedure without any incident. Patient tolerated procedure well and received replacement fluids after procedure.  Patient understands to call if he has any questions or concerns post discharge.

## 2021-04-04 NOTE — Patient Instructions (Signed)

## 2021-05-08 ENCOUNTER — Other Ambulatory Visit: Payer: Self-pay | Admitting: *Deleted

## 2021-05-08 MED ORDER — ALPRAZOLAM 2 MG PO TABS: 2.0000 mg | ORAL_TABLET | Freq: Every evening | ORAL | 0 refills | Status: DC | PRN

## 2021-05-08 NOTE — Telephone Encounter (Signed)
[facility] called and stated that they only dispense 30 day supply of the patients Xanax  2 mg tablets. They asked if Dr. Madilyn Fireman can do an override for this medication to be sent to her pharmacy for either a 2 week or 30 day supply and that this would be up to Dr. Gardiner Ramus discretion on how to fill. It would just need to indicate this on the prescription when filled.   [facility] cannot get this shipped this out to her in enough time. They stated that she is actually overdue for this refill now.

## 2021-06-26 ENCOUNTER — Inpatient Hospital Stay: Payer: Medicare HMO | Admitting: Family

## 2021-06-26 ENCOUNTER — Inpatient Hospital Stay: Payer: Medicare HMO

## 2021-07-02 ENCOUNTER — Telehealth: Payer: Self-pay | Admitting: Family Medicine

## 2021-07-02 NOTE — Telephone Encounter (Signed)
Mr.Passow dropped form for Disability Parking Placard off late this afternoon.

## 2021-07-04 NOTE — Telephone Encounter (Signed)
Completed and put in [facility] B box

## 2021-07-04 NOTE — Telephone Encounter (Signed)
Pt advised that form is up front for p/u

## 2021-07-08 ENCOUNTER — Telehealth: Payer: Self-pay

## 2021-08-02 ENCOUNTER — Other Ambulatory Visit: Payer: Self-pay | Admitting: Family Medicine

## 2021-08-02 DIAGNOSIS — F5101 Primary insomnia: Secondary | ICD-10-CM

## 2021-08-02 DIAGNOSIS — F411 Generalized anxiety disorder: Secondary | ICD-10-CM

## 2021-08-28 ENCOUNTER — Encounter: Payer: Self-pay | Admitting: Family Medicine

## 2021-09-23 ENCOUNTER — Ambulatory Visit (INDEPENDENT_AMBULATORY_CARE_PROVIDER_SITE_OTHER): Payer: Medicare HMO | Admitting: Family Medicine

## 2021-09-23 DIAGNOSIS — Z0000 Encounter for general adult medical examination without abnormal findings: Secondary | ICD-10-CM

## 2021-09-23 NOTE — Patient Instructions (Addendum)
[place] Maintenance Summary and Written Plan of Care  [name] ,  Thank you for allowing me to perform your Medicare Annual Wellness Visit and for your ongoing commitment to your health.   Health Maintenance & Immunization History Health Maintenance  Topic Date Due   COVID-19 Vaccine (1) 10/09/2021 (Originally 04/17/1957)   Zoster Vaccines- Shingrix (1 of 2) 12/23/2021 (Originally 10/16/2004)   MAMMOGRAM  02/26/2022 (Originally 05/14/2013)   COLON CANCER SCREENING ANNUAL FOBT  03/23/2022 (Originally 11/19/2017)   INFLUENZA VACCINE  03/28/2022 (Originally 07/29/2021)   COLONOSCOPY (Pts 45-55yrs Insurance coverage will need to be confirmed)  09/23/2022 (Originally 05/14/2021)   TETANUS/TDAP  09/03/2023   DEXA SCAN  Completed   Hepatitis C Screening  Completed   HPV VACCINES  Aged Out   Immunization History  Administered Date(s) Administered   Tdap 09/02/2013    These are the patient goals that we discussed:  Goals Addressed               This Visit's Progress     Patient Stated (pt-stated)        09/23/2021 AWV Goal: Exercise for General Health  Patient will verbalize understanding of the benefits of increased physical activity: Exercising regularly is important. It will improve your overall fitness, flexibility, and endurance. Regular exercise also will improve your overall health. It can help you control your weight, reduce stress, and improve your bone density. Over the next year, patient will increase physical activity as tolerated with a goal of at least 150 minutes of moderate physical activity per week.  You can tell that you are exercising at a moderate intensity if your heart starts beating faster and you start breathing faster but can still hold a conversation. Moderate-intensity exercise ideas include: Walking 1 mile (1.6 km) in about 15 minutes Biking Hiking Golfing Dancing Water aerobics Patient will verbalize understanding of  everyday activities that increase physical activity by providing examples like the following: Yard work, such as: Sales promotion account executive Gardening Washing windows or floors Patient will be able to explain general safety guidelines for exercising:  Before you start a new exercise program, talk with your health care provider. Do not exercise so much that you hurt yourself, feel dizzy, or get very short of breath. Wear comfortable clothes and wear shoes with good support. Drink plenty of water while you exercise to prevent dehydration or heat stroke. Work out until your breathing and your heartbeat get faster.          This is a list of Health Maintenance Items that are overdue or due now: Pneumococcal vaccine  Influenza vaccine Screening mammography Bone densitometry screening Colorectal cancer screening Shingles vaccine Eye Exam Covid vaccine  Orders/Referrals Placed Today: No orders of the defined types were placed in this encounter.  (Contact our referral department at [phone] if you have not spoken with someone about your referral appointment within the next 5 days)    Follow-up Plan Follow-up with Hali Marry, MD as planned Please let us know if you change your mind about scheduling any of the screenings or the vaccines.  Please schedule your eye exam. Medicare wellness visit in one year. Patient stated that she will access her AVS on mychart.  Health Maintenance, Female Adopting a healthy lifestyle and getting preventive care are important in promoting health and wellness. Ask your health care provider about: The right schedule for you  to have regular tests and exams. Things you can do on your own to prevent diseases and keep yourself healthy. What should I know about diet, weight, and exercise? Eat a healthy diet  Eat a diet that includes plenty of vegetables, fruits,  low-fat dairy products, and lean protein. Do not eat a lot of foods that are high in solid fats, added sugars, or sodium. Maintain a healthy weight Body mass index (BMI) is used to identify weight problems. It estimates body fat based on height and weight. Your health care provider can help determine your BMI and help you achieve or maintain a healthy weight. Get regular exercise Get regular exercise. This is one of the most important things you can do for your health. Most adults should: Exercise for at least 150 minutes each week. The exercise should increase your heart rate and make you sweat (moderate-intensity exercise). Do strengthening exercises at least twice a week. This is in addition to the moderate-intensity exercise. Spend less time sitting. Even light physical activity can be beneficial. Watch cholesterol and blood lipids Have your blood tested for lipids and cholesterol at 67 years of age, then have this test every 5 years. Have your cholesterol levels checked more often if: Your lipid or cholesterol levels are high. You are older than 67 years of age. You are at high risk for heart disease. What should I know about cancer screening? Depending on your health history and family history, you may need to have cancer screening at various ages. This may include screening for: Breast cancer. Cervical cancer. Colorectal cancer. Skin cancer. Lung cancer. What should I know about heart disease, diabetes, and high blood pressure? Blood pressure and heart disease High blood pressure causes heart disease and increases the risk of stroke. This is more likely to develop in people who have high blood pressure readings, are of African descent, or are overweight. Have your blood pressure checked: Every 3-5 years if you are 53-20 years of age. Every year if you are 43 years old or older. Diabetes Have regular diabetes screenings. This checks your fasting blood sugar level. Have the  screening done: Once every three years after age 80 if you are at a normal weight and have a low risk for diabetes. More often and at a younger age if you are overweight or have a high risk for diabetes. What should I know about preventing infection? Hepatitis B If you have a higher risk for hepatitis B, you should be screened for this virus. Talk with your health care provider to find out if you are at risk for hepatitis B infection. Hepatitis C Testing is recommended for: Everyone born from [birth year] through 1967. Anyone with known risk factors for hepatitis C. Sexually transmitted infections (STIs) Get screened for STIs, including gonorrhea and chlamydia, if: You are sexually active and are younger than 67 years of age. You are older than 67 years of age and your health care provider tells you that you are at risk for this type of infection. Your sexual activity has changed since you were last screened, and you are at increased risk for chlamydia or gonorrhea. Ask your health care provider if you are at risk. Ask your health care provider about whether you are at high risk for HIV. Your health care provider may recommend a prescription medicine to help prevent HIV infection. If you choose to take medicine to prevent HIV, you should first get tested for HIV. You should then be tested every 3  months for as long as you are taking the medicine. Pregnancy If you are about to stop having your period (premenopausal) and you may become pregnant, seek counseling before you get pregnant. Take 400 to 800 micrograms (mcg) of folic acid every day if you become pregnant. Ask for birth control (contraception) if you want to prevent pregnancy. Osteoporosis and menopause Osteoporosis is a disease in which the bones lose minerals and strength with aging. This can result in bone fractures. If you are 44 years old or older, or if you are at risk for osteoporosis and fractures, ask your health care provider if you  should: Be screened for bone loss. Take a calcium or vitamin D supplement to lower your risk of fractures. Be given hormone replacement therapy (HRT) to treat symptoms of menopause. Follow these instructions at home: Lifestyle Do not use any products that contain nicotine or tobacco, such as cigarettes, e-cigarettes, and chewing tobacco. If you need help quitting, ask your health care provider. Do not use street drugs. Do not share needles. Ask your health care provider for help if you need support or information about quitting drugs. Alcohol use Do not drink alcohol if: Your health care provider tells you not to drink. You are pregnant, may be pregnant, or are planning to become pregnant. If you drink alcohol: Limit how much you use to 0-1 drink a day. Limit intake if you are breastfeeding. Be aware of how much alcohol is in your drink. In the [place] one drink equals one 12 oz bottle of beer (355 mL), one 5 oz glass of wine (148 mL), or one 1 oz glass of hard liquor (44 mL). General instructions Schedule regular health, dental, and eye exams. Stay current with your vaccines. Tell your health care provider if: You often feel depressed. You have ever been abused or do not feel safe at home. Summary Adopting a healthy lifestyle and getting preventive care are important in promoting health and wellness. Follow your health care provider's instructions about healthy diet, exercising, and getting tested or screened for diseases. Follow your health care provider's instructions on monitoring your cholesterol and blood pressure. This information is not intended to replace advice given to you by your health care provider. Make sure you discuss any questions you have with your health care provider. Document Revised: 02/22/2021 Document Reviewed: 12/08/2018 Elsevier Patient Education  2022 [employer].
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
## 2021-10-25 ENCOUNTER — Other Ambulatory Visit: Payer: Self-pay | Admitting: Osteopathic Medicine

## 2021-10-25 DIAGNOSIS — F411 Generalized anxiety disorder: Secondary | ICD-10-CM

## 2021-10-25 DIAGNOSIS — F5101 Primary insomnia: Secondary | ICD-10-CM

## 2022-01-08 ENCOUNTER — Ambulatory Visit: Payer: Medicare HMO | Admitting: Family Medicine

## 2022-05-22 ENCOUNTER — Other Ambulatory Visit: Payer: Self-pay | Admitting: *Deleted

## 2022-05-22 DIAGNOSIS — F5101 Primary insomnia: Secondary | ICD-10-CM

## 2022-05-22 DIAGNOSIS — F411 Generalized anxiety disorder: Secondary | ICD-10-CM

## 2022-05-22 MED ORDER — ALPRAZOLAM 2 MG PO TABS: 2.0000 mg | ORAL_TABLET | Freq: Every evening | ORAL | 0 refills | Status: DC | PRN

## 2022-05-22 NOTE — Telephone Encounter (Signed)
Please call pt and advise her that she will need to schedule a f/u appt. She hasn't been seen in office sine 02/2021.

## 2022-05-22 NOTE — Telephone Encounter (Signed)
Patient stated her days are VERY full and she NEEDS her medication in a week because she will run out, and not be able sleep. Patient wants to know her options.Marland KitchenMarland Kitchen

## 2022-06-26 ENCOUNTER — Ambulatory Visit (INDEPENDENT_AMBULATORY_CARE_PROVIDER_SITE_OTHER): Payer: Medicare HMO | Admitting: Family Medicine

## 2022-06-26 ENCOUNTER — Encounter: Payer: Self-pay | Admitting: Family Medicine

## 2022-06-26 VITALS — BP 111/72 | HR 69 | Resp 16 | Ht 61.0 in | Wt 130.0 lb

## 2022-06-26 DIAGNOSIS — F5101 Primary insomnia: Secondary | ICD-10-CM

## 2022-06-26 DIAGNOSIS — F411 Generalized anxiety disorder: Secondary | ICD-10-CM

## 2022-06-26 DIAGNOSIS — I700 Atherosclerosis of aorta: Secondary | ICD-10-CM | POA: Diagnosis not present

## 2022-06-26 DIAGNOSIS — R7301 Impaired fasting glucose: Secondary | ICD-10-CM | POA: Diagnosis not present

## 2022-06-26 DIAGNOSIS — J449 Chronic obstructive pulmonary disease, unspecified: Secondary | ICD-10-CM | POA: Diagnosis not present

## 2022-06-26 MED ORDER — ALPRAZOLAM 2 MG PO TABS: 2.0000 mg | ORAL_TABLET | Freq: Every evening | ORAL | 1 refills | Status: DC | PRN

## 2022-06-26 NOTE — Progress Notes (Signed)
Established Patient Office Visit  Subjective   Patient ID: Ann Guerrero, female    DOB: 05-20-1956  Age: 66 y.o. MRN: [id number]  Chief Complaint  Patient presents with   Follow up     Patient in office for medication follow up     HPI  F/U insomnia  -she mostly uses the alprazolam at night to sleep.  More recently she has been cutting the tab in half.  Impaired fasting glucose-no increased thirst or urination. No symptoms consistent with hypoglycemia.     ROS    Objective:     BP 111/72   Pulse 69   Resp 16   Ht '5\' 1"'$  (1.549 m)   Wt 130 lb (59 kg)   SpO2 96%   BMI 24.56 kg/m    Physical Exam Vitals and nursing note reviewed.  Constitutional:      Appearance: [name] well-developed.  HENT:     Head: Normocephalic and atraumatic.  Cardiovascular:     Rate and Rhythm: Normal rate and regular rhythm.     Heart sounds: Normal heart sounds.  Pulmonary:     Effort: Pulmonary effort is normal.     Breath sounds: Normal breath sounds.  Skin:    General: Skin is warm and dry.  Neurological:     Mental Status: [name] alert and oriented to person, place, and time.  Psychiatric:        Behavior: Behavior normal.      No results found for any visits on 06/26/22.    The 10-year ASCVD risk score (Arnett DK, et al., 2019) is: 4.7%    Assessment & Plan:   Problem List Items Addressed This Visit       Cardiovascular and Mediastinum   Aortic atherosclerosis ([place])     Endocrine   IFG (impaired fasting glucose) - Primary    Due to recheck A1C.  She has been working diligently to get her weight down.  So she is really been eating a little bit better.  And she said she got up to 150 pounds and she is already back down to 130 pounds which is great      Relevant Orders   COMPLETE METABOLIC PANEL WITH GFR   Lipid panel   CBC   TSH   Hemoglobin A1c     Other   INSOMNIA    She mostly uses the Xanax to help her sleep and to really calm her mind down and  she is tried a couple other sleep aids in the past and had side effects.  We did discuss an option such as Belsomra which is a newer class of sleep aids that has a pretty clean side effect profile.  We would have to see if her insurance would cover it but I encouraged her to consider it.  She would have to taper off of the Xanax to use it.      Relevant Medications   alprazolam (XANAX) 2 MG tablet   Hereditary hemochromatosis ([place])    We will check her CBC today.  Last time she saw hematology was about a year ago.      Anxiety state    Increased stressors taking care of her 52-year-old and 19-year-old they have formally adopted the 69-year-old and is in their custody.  Likely will also happen with a 78-year-old.      Relevant Medications   alprazolam (XANAX) 2 MG tablet    Return in about 6 months (around  12/26/2022) for Medication follow up/Sleep .    Beatrice Lecher, MD

## 2022-06-26 NOTE — Assessment & Plan Note (Signed)
She mostly uses the Xanax to help her sleep and to really calm her mind down and she is tried a couple other sleep aids in the past and had side effects.  We did discuss an option such as Belsomra which is a newer class of sleep aids that has a pretty clean side effect profile.  We would have to see if her insurance would cover it but I encouraged her to consider it.  She would have to taper off of the Xanax to use it.

## 2022-06-26 NOTE — Assessment & Plan Note (Signed)
Due to recheck A1C.  She has been working diligently to get her weight down.  So she is really been eating a little bit better.  And she said she got up to 150 pounds and she is already back down to 130 pounds which is great

## 2022-06-26 NOTE — Assessment & Plan Note (Signed)
We will check her CBC today.  Last time she saw hematology was about a year ago.

## 2022-06-26 NOTE — Assessment & Plan Note (Signed)
Increased stressors taking care of her 68-year-old and 63-year-old they have formally adopted the 61-year-old and is in their custody.  Likely will also happen with a 31-year-old.

## 2022-06-27 LAB — CBC
HCT: 45.9 % — ABNORMAL HIGH (ref 35.0–45.0)
Hemoglobin: 15.2 g/dL (ref 11.7–15.5)
MCH: 30.6 pg (ref 27.0–33.0)
MCHC: 33.1 g/dL (ref 32.0–36.0)
MCV: 92.5 fL (ref 80.0–100.0)
MPV: 9.9 fL (ref 7.5–12.5)
Platelets: 287 10*3/uL (ref 140–400)
RBC: 4.96 10*6/uL (ref 3.80–5.10)
RDW: 12.3 % (ref 11.0–15.0)
WBC: 6.6 10*3/uL (ref 3.8–10.8)

## 2022-06-27 LAB — COMPLETE METABOLIC PANEL WITH GFR
AG Ratio: 1.3 (calc) (ref 1.0–2.5)
ALT: 9 U/L (ref 6–29)
AST: 16 U/L (ref 10–35)
Albumin: 3.9 g/dL (ref 3.6–5.1)
Alkaline phosphatase (APISO): 65 U/L (ref 37–153)
BUN: 14 mg/dL (ref 7–25)
CO2: 28 mmol/L (ref 20–32)
Calcium: 9.2 mg/dL (ref 8.6–10.4)
Chloride: 104 mmol/L (ref 98–110)
Creat: 0.8 mg/dL (ref 0.50–1.05)
Globulin: 2.9 g/dL (calc) (ref 1.9–3.7)
Glucose, Bld: 89 mg/dL (ref 65–99)
Potassium: 4.5 mmol/L (ref 3.5–5.3)
Sodium: 139 mmol/L (ref 135–146)
Total Bilirubin: 0.6 mg/dL (ref 0.2–1.2)
Total Protein: 6.8 g/dL (ref 6.1–8.1)
eGFR: 81 mL/min/{1.73_m2} (ref >=60)

## 2022-06-27 LAB — LIPID PANEL
Cholesterol: 178 mg/dL (ref <200)
HDL: 60 mg/dL (ref >=50)
LDL Cholesterol (Calc): 96 mg/dL (calc)
Non-HDL Cholesterol (Calc): 118 mg/dL (calc) (ref <130)
Total CHOL/HDL Ratio: 3 (calc) (ref <5.0)
Triglycerides: 123 mg/dL (ref <150)

## 2022-06-27 LAB — TSH: TSH: 3.14 mIU/L (ref 0.40–4.50)

## 2022-06-27 LAB — HEMOGLOBIN A1C
Hgb A1c MFr Bld: 5.8 % of total Hgb — ABNORMAL HIGH (ref <5.7)
Mean Plasma Glucose: 120 mg/dL
eAG (mmol/L): 6.6 mmol/L

## 2022-09-29 ENCOUNTER — Ambulatory Visit (INDEPENDENT_AMBULATORY_CARE_PROVIDER_SITE_OTHER): Payer: Medicare HMO | Admitting: Family Medicine

## 2022-09-29 DIAGNOSIS — Z0000 Encounter for general adult medical examination without abnormal findings: Secondary | ICD-10-CM

## 2022-09-29 DIAGNOSIS — Z87891 Personal history of nicotine dependence: Secondary | ICD-10-CM

## 2022-09-29 DIAGNOSIS — Z1211 Encounter for screening for malignant neoplasm of colon: Secondary | ICD-10-CM

## 2022-09-29 DIAGNOSIS — Z1231 Encounter for screening mammogram for malignant neoplasm of breast: Secondary | ICD-10-CM

## 2022-09-29 DIAGNOSIS — Z780 Asymptomatic menopausal state: Secondary | ICD-10-CM

## 2022-09-29 DIAGNOSIS — Z122 Encounter for screening for malignant neoplasm of respiratory organs: Secondary | ICD-10-CM
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
## 2022-09-29 NOTE — Patient Instructions (Signed)
[place] Maintenance Summary and Written Plan of Care  [name] ,  Thank you for allowing me to perform your Medicare Annual Wellness Visit and for your ongoing commitment to your health.   Health Maintenance & Immunization History Health Maintenance  Topic Date Due   COVID-19 Vaccine (1) 10/15/2022 (Originally 04/17/1957)   Zoster Vaccines- Shingrix (1 of 2) 12/30/2022 (Originally 10/16/2004)   INFLUENZA VACCINE  03/29/2023 (Originally 07/29/2022)   COLON CANCER SCREENING ANNUAL FOBT  03/31/2023 (Originally 11/19/2017)   Pneumonia Vaccine 69+ Years old (1 - PCV) 06/27/2023 (Originally 10/17/2019)   MAMMOGRAM  06/27/2023 (Originally 05/14/2013)   COLONOSCOPY (Pts 45-58yr Insurance coverage will need to be confirmed)  09/30/2023 (Originally 05/14/2021)   TETANUS/TDAP  09/03/2023   DEXA SCAN  Completed   Hepatitis C Screening  Completed   HPV VACCINES  Aged Out   Immunization History  Administered Date(s) Administered   Tdap 09/02/2013    These are the patient goals that we discussed:  Goals Addressed               This Visit's Progress     Patient Stated (pt-stated)        09/29/2022 AWV Goal: Exercise for General Health  Patient will verbalize understanding of the benefits of increased physical activity: Exercising regularly is important. It will improve your overall fitness, flexibility, and endurance. Regular exercise also will improve your overall health. It can help you control your weight, reduce stress, and improve your bone density. Over the next year, patient will increase physical activity as tolerated with a goal of at least 150 minutes of moderate physical activity per week.  You can tell that you are exercising at a moderate intensity if your heart starts beating faster and you start breathing faster but can still hold a conversation. Moderate-intensity exercise ideas include: Walking 1 mile (1.6 km) in about 15  minutes Biking Hiking Golfing Dancing Water aerobics Patient will verbalize understanding of everyday activities that increase physical activity by providing examples like the following: Yard work, such as: PSales promotion account executiveGardening Washing windows or floors Patient will be able to explain general safety guidelines for exercising:  Before you start a new exercise program, talk with your health care provider. Do not exercise so much that you hurt yourself, feel dizzy, or get very short of breath. Wear comfortable clothes and wear shoes with good support. Drink plenty of water while you exercise to prevent dehydration or heat stroke. Work out until your breathing and your heartbeat get faster.          This is a list of Health Maintenance Items that are overdue or due now: There are no preventive care reminders to display for this patient.   Orders/Referrals Placed Today: Orders Placed This Encounter  Procedures   DEXAScan    Standing Status:   Future    Standing Expiration Date:   09/30/2023    Scheduling Instructions:     Please call patient to schedule.    Order Specific Question:   Reason for exam:    Answer:   post menopausal    Order Specific Question:   Preferred imaging [facility]?    [facility] 3D SCREEN BREAST BILATERAL    Standing Status:   Future    Standing Expiration Date:   09/30/2023    Scheduling Instructions:  Please call patient to schedule.    Order Specific Question:   Reason for Exam (SYMPTOM  OR DIAGNOSIS REQUIRED)    Answer:   breast cancer screening    Order Specific Question:   Preferred imaging location?    [facility]   Cologuard   Ambulatory Referral for Lung Cancer Scre    Referral Priority:   Routine    Referral Type:   Consultation    Referral Reason:   Specialty Services Required    Number of  Visits Requested:   1    (Contact our referral department at [phone] if you have not spoken with someone about your referral appointment within the next 5 days)    Follow-up Plan Follow-up with Hali Marry, MD as planned Referrals for your bone density scan, mammogram, cologuard and lung cancer screening has been sent and they will call you to schedule appointments.  Medicare wellness visit in one year.  Patient will access AVS on my chart.     Health Maintenance, Female Adopting a healthy lifestyle and getting preventive care are important in promoting health and wellness. Ask your health care provider about: The right schedule for you to have regular tests and exams. Things you can do on your own to prevent diseases and keep yourself healthy. What should I know about diet, weight, and exercise? Eat a healthy diet  Eat a diet that includes plenty of vegetables, fruits, low-fat dairy products, and lean protein. Do not eat a lot of foods that are high in solid fats, added sugars, or sodium. Maintain a healthy weight Body mass index (BMI) is used to identify weight problems. It estimates body fat based on height and weight. Your health care provider can help determine your BMI and help you achieve or maintain a healthy weight. Get regular exercise Get regular exercise. This is one of the most important things you can do for your health. Most adults should: Exercise for at least 150 minutes each week. The exercise should increase your heart rate and make you sweat (moderate-intensity exercise). Do strengthening exercises at least twice a week. This is in addition to the moderate-intensity exercise. Spend less time sitting. Even light physical activity can be beneficial. Watch cholesterol and blood lipids Have your blood tested for lipids and cholesterol at 68 years of age, then have this test every 5 years. Have your cholesterol levels checked more often if: Your lipid or  cholesterol levels are high. You are older than 68 years of age. You are at high risk for heart disease. What should I know about cancer screening? Depending on your health history and family history, you may need to have cancer screening at various ages. This may include screening for: Breast cancer. Cervical cancer. Colorectal cancer. Skin cancer. Lung cancer. What should I know about heart disease, diabetes, and high blood pressure? Blood pressure and heart disease High blood pressure causes heart disease and increases the risk of stroke. This is more likely to develop in people who have high blood pressure readings or are overweight. Have your blood pressure checked: Every 3-5 years if you are 69-76 years of age. Every year if you are 1 years old or older. Diabetes Have regular diabetes screenings. This checks your fasting blood sugar level. Have the screening done: Once every three years after age 49 if you are at a normal weight and have a low risk for diabetes. More often and at a younger age if you are overweight  or have a high risk for diabetes. What should I know about preventing infection? Hepatitis B If you have a higher risk for hepatitis B, you should be screened for this virus. Talk with your health care provider to find out if you are at risk for hepatitis B infection. Hepatitis C Testing is recommended for: Everyone born from [birth year] through 1965. Anyone with known risk factors for hepatitis C. Sexually transmitted infections (STIs) Get screened for STIs, including gonorrhea and chlamydia, if: You are sexually active and are younger than 68 years of age. You are older than 68 years of age and your health care provider tells you that you are at risk for this type of infection. Your sexual activity has changed since you were last screened, and you are at increased risk for chlamydia or gonorrhea. Ask your health care provider if you are at risk. Ask your health care  provider about whether you are at high risk for HIV. Your health care provider may recommend a prescription medicine to help prevent HIV infection. If you choose to take medicine to prevent HIV, you should first get tested for HIV. You should then be tested every 3 months for as long as you are taking the medicine. Pregnancy If you are about to stop having your period (premenopausal) and you may become pregnant, seek counseling before you get pregnant. Take 400 to 800 micrograms (mcg) of folic acid every day if you become pregnant. Ask for birth control (contraception) if you want to prevent pregnancy. Osteoporosis and menopause Osteoporosis is a disease in which the bones lose minerals and strength with aging. This can result in bone fractures. If you are 108 years old or older, or if you are at risk for osteoporosis and fractures, ask your health care provider if you should: Be screened for bone loss. Take a calcium or vitamin D supplement to lower your risk of fractures. Be given hormone replacement therapy (HRT) to treat symptoms of menopause. Follow these instructions at home: Alcohol use Do not drink alcohol if: Your health care provider tells you not to drink. You are pregnant, may be pregnant, or are planning to become pregnant. If you drink alcohol: Limit how much you have to: 0-1 drink a day. Know how much alcohol is in your drink. In the [place] one drink equals one 12 oz bottle of beer (355 mL), one 5 oz glass of wine (148 mL), or one 1 oz glass of hard liquor (44 mL). Lifestyle Do not use any products that contain nicotine or tobacco. These products include cigarettes, chewing tobacco, and vaping devices, such as e-cigarettes. If you need help quitting, ask your health care provider. Do not use street drugs. Do not share needles. Ask your health care provider for help if you need support or information about quitting drugs. General instructions Schedule regular health, dental, and  eye exams. Stay current with your vaccines. Tell your health care provider if: You often feel depressed. You have ever been abused or do not feel safe at home. Summary Adopting a healthy lifestyle and getting preventive care are important in promoting health and wellness. Follow your health care provider's instructions about healthy diet, exercising, and getting tested or screened for diseases. Follow your health care provider's instructions on monitoring your cholesterol and blood pressure. This information is not intended to replace advice given to you by your health care provider. Make sure you discuss any questions you have with your health care provider. Document Revised: 05/06/2021 Document Reviewed:  05/06/2021 Elsevier Patient Education  [place].

## 2022-10-13 DIAGNOSIS — Z1212 Encounter for screening for malignant neoplasm of rectum: Secondary | ICD-10-CM | POA: Diagnosis not present

## 2022-10-13 DIAGNOSIS — Z1211 Encounter for screening for malignant neoplasm of colon: Secondary | ICD-10-CM | POA: Diagnosis not present

## 2022-10-21 LAB — COLOGUARD: COLOGUARD: NEGATIVE

## 2022-10-21 NOTE — Progress Notes (Signed)
Great news! Your Cologuard test is negative.  Recommend repeat colon cancer screening in 3 years.

## 2022-10-29 ENCOUNTER — Ambulatory Visit (INDEPENDENT_AMBULATORY_CARE_PROVIDER_SITE_OTHER): Payer: Medicare HMO

## 2022-10-29 DIAGNOSIS — Z0000 Encounter for general adult medical examination without abnormal findings: Secondary | ICD-10-CM | POA: Diagnosis not present

## 2022-10-29 DIAGNOSIS — Z1231 Encounter for screening mammogram for malignant neoplasm of breast: Secondary | ICD-10-CM | POA: Diagnosis not present

## 2022-10-29 DIAGNOSIS — Z780 Asymptomatic menopausal state: Secondary | ICD-10-CM

## 2022-10-29 DIAGNOSIS — M810 Age-related osteoporosis without current pathological fracture: Secondary | ICD-10-CM | POA: Diagnosis not present

## 2022-10-29 NOTE — Progress Notes (Signed)
Ann Guerrero, your bone density shows a T score of -3.2 which puts you into the category of osteoporosis.   The current recommendation for osteoporosis treatment includes:   #1 calcium-total of 1200 mg of calcium daily.  If you eat a very calcium rich diet you may be able to obtain that without a supplement.  If not, then I recommend calcium 500 mg twice a day.  There are several products over-the-counter such as Caltrate D and Viactiv chews which are great options that contain calcium and vitamin D. #2 vitamin D-recommend 800 international units daily. #3 exercise-recommend 30 minutes of weightbearing exercise 3 days a week.  Resistance training ,such as doing bands and light weights, can be particularly helpful. #4 medication-if you are not currently on a bone builder, also called a bisphosphonate, then this has been shown to be very helpful in maintaining bone strength, preventing further thinning of the bones, and reducing your risk for fractures.  I would highly recommend that you consider starting 1 of these medications.  If you are okay with that then please let us know and we will send one to your pharmacy.  If you would like to discuss further we are happy to make an appointment for you so that we can go over options for treatment.

## 2022-10-29 NOTE — Progress Notes (Signed)
Please don't up the Vit D stay on current dose until we can check a blood level. Too high a level can be dangerous for the liver

## 2022-10-31 NOTE — Progress Notes (Signed)
Please call patient. Normal mammogram.  Repeat in 1 year.  

## 2022-11-05 DIAGNOSIS — H26492 Other secondary cataract, left eye: Secondary | ICD-10-CM | POA: Diagnosis not present

## 2022-11-05 DIAGNOSIS — H5213 Myopia, bilateral: Secondary | ICD-10-CM | POA: Diagnosis not present

## 2022-11-05 DIAGNOSIS — H04123 Dry eye syndrome of bilateral lacrimal glands: Secondary | ICD-10-CM | POA: Diagnosis not present

## 2022-11-05 DIAGNOSIS — H524 Presbyopia: Secondary | ICD-10-CM | POA: Diagnosis not present

## 2022-11-05 DIAGNOSIS — Z961 Presence of intraocular lens: Secondary | ICD-10-CM | POA: Diagnosis not present

## 2022-12-23 ENCOUNTER — Other Ambulatory Visit: Payer: Self-pay | Admitting: Family Medicine

## 2022-12-23 DIAGNOSIS — F5101 Primary insomnia: Secondary | ICD-10-CM

## 2022-12-23 DIAGNOSIS — F411 Generalized anxiety disorder: Secondary | ICD-10-CM

## 2022-12-25 DIAGNOSIS — Z961 Presence of intraocular lens: Secondary | ICD-10-CM | POA: Diagnosis not present

## 2022-12-25 DIAGNOSIS — H26493 Other secondary cataract, bilateral: Secondary | ICD-10-CM | POA: Diagnosis not present

## 2022-12-25 DIAGNOSIS — H43811 Vitreous degeneration, right eye: Secondary | ICD-10-CM | POA: Diagnosis not present

## 2022-12-26 ENCOUNTER — Encounter: Payer: Self-pay | Admitting: Family Medicine

## 2022-12-26 ENCOUNTER — Ambulatory Visit (INDEPENDENT_AMBULATORY_CARE_PROVIDER_SITE_OTHER): Payer: Medicare HMO | Admitting: Family Medicine

## 2022-12-26 VITALS — BP 119/78 | HR 79 | Ht 61.0 in | Wt 130.0 lb

## 2022-12-26 DIAGNOSIS — F5101 Primary insomnia: Secondary | ICD-10-CM | POA: Diagnosis not present

## 2022-12-26 DIAGNOSIS — F411 Generalized anxiety disorder: Secondary | ICD-10-CM | POA: Diagnosis not present

## 2022-12-26 DIAGNOSIS — R7301 Impaired fasting glucose: Secondary | ICD-10-CM

## 2022-12-26 LAB — POCT GLYCOSYLATED HEMOGLOBIN (HGB A1C): Hemoglobin A1C: 6.1 % — AB (ref 4.0–5.6)

## 2022-12-26 MED ORDER — ALPRAZOLAM 1 MG PO TABS: ORAL_TABLET | ORAL | 0 refills | Status: DC

## 2022-12-26 NOTE — Assessment & Plan Note (Signed)
Consider trial of Belsomra or trazodone.  She wants to do a little research and reading and so I wrote down the names of both of those medications for her to consider.  Would prefer to really get her off the Xanax because of increased risk for dementia etc.

## 2022-12-26 NOTE — Progress Notes (Signed)
Established Patient Office Visit  Subjective   Patient ID: Ann Guerrero, female    DOB: [birth year]  Age: 66 y.o. MRN: [id number]  Chief Complaint  Patient presents with   ifg   Insomnia    HPI  Impaired fasting glucose-no increased thirst or urination. No symptoms consistent with hypoglycemia.  F/U insomnia -only uses Xanax at night to help with sleep.  She has been splitting it in half and says when she takes a half a tablet she just does not sleep well at all.  We have tried multiple sleep aids in the past but they just have not really been helpful.  Ambien did not work well.  Lunesta called let nightmares.  She does not remember trying trazodone or Belsomra.  They adopted a 33-year-old and she is doing well but it has been stressful as they have already adopted the sibling who is 31 years old both little girls.    ROS    Objective:     BP 119/78   Pulse 79   Ht '5\' 1"'$  (1.549 m)   Wt 130 lb (59 kg)   SpO2 97%   BMI 24.56 kg/m    Physical Exam Vitals and nursing note reviewed.  Constitutional:      Appearance: [name] well-developed.  HENT:     Head: Normocephalic and atraumatic.  Cardiovascular:     Rate and Rhythm: Normal rate and regular rhythm.     Heart sounds: Normal heart sounds.  Pulmonary:     Effort: Pulmonary effort is normal.     Breath sounds: Normal breath sounds.  Skin:    General: Skin is warm and dry.  Neurological:     Mental Status: [name] alert and oriented to person, place, and time.  Psychiatric:        Behavior: Behavior normal.      Results for orders placed or performed in visit on 12/26/22  POCT glycosylated hemoglobin (Hb A1C)  Result Value Ref Range   Hemoglobin A1C 6.1 (A) 4.0 - 5.6 %   HbA1c POC (<> result, manual entry)     HbA1c, POC (prediabetic range)     HbA1c, POC (controlled diabetic range)        The 10-year ASCVD risk score (Arnett DK, et al., 2019) is: 6.2%    Assessment & Plan:   Problem List Items  Addressed This Visit       Endocrine   IFG (impaired fasting glucose) - Primary    A1c 6.1 today which is up just a little bit from previous of 5.8.  Continue to work on [employer] and staying active.  Lab Results  Component Value Date   HGBA1C 6.1 (A) 12/26/2022        Relevant Orders   POCT glycosylated hemoglobin (Hb A1C) (Completed)     Other   INSOMNIA    Consider trial of Belsomra or trazodone.  She wants to do a little research and reading and so I wrote down the names of both of those medications for her to consider.  Would prefer to really get her off the Xanax because of increased risk for dementia etc.      Relevant Medications   alprazolam (XANAX) 1 MG tablet   Anxiety state    Will change alprazolam to 1 mg tabs that she has been splitting the 2 mg.  Continue to work on cutting back and taking as needed.      Relevant Medications  alprazolam (XANAX) 1 MG tablet    Return in about 6 months (around 06/27/2023).    Beatrice Lecher, MD

## 2022-12-26 NOTE — Assessment & Plan Note (Signed)
Will change alprazolam to 1 mg tabs that she has been splitting the 2 mg.  Continue to work on cutting back and taking as needed.

## 2022-12-26 NOTE — Patient Instructions (Signed)
For sleep we could consider a trial of Belsomra.  It is taken daily and has a pretty good side effect profile.  Other 1 that we could also consider is an older sleep aid but is generic and works well called trazodone.

## 2022-12-26 NOTE — Assessment & Plan Note (Signed)
A1c 6.1 today which is up just a little bit from previous of 5.8.  Continue to work on [employer] and staying active.  Lab Results  Component Value Date   HGBA1C 6.1 (A) 12/26/2022

## 2023-01-19 DIAGNOSIS — H26491 Other secondary cataract, right eye: Secondary | ICD-10-CM | POA: Diagnosis not present

## 2023-04-22 DIAGNOSIS — Z961 Presence of intraocular lens: Secondary | ICD-10-CM | POA: Diagnosis not present

## 2023-05-27 ENCOUNTER — Other Ambulatory Visit: Payer: Self-pay | Admitting: Family Medicine

## 2023-05-27 DIAGNOSIS — F411 Generalized anxiety disorder: Secondary | ICD-10-CM

## 2023-05-27 DIAGNOSIS — F5101 Primary insomnia: Secondary | ICD-10-CM

## 2023-06-04 ENCOUNTER — Ambulatory Visit (INDEPENDENT_AMBULATORY_CARE_PROVIDER_SITE_OTHER): Payer: Medicare HMO | Admitting: Sports Medicine

## 2023-06-04 ENCOUNTER — Telehealth: Payer: Self-pay | Admitting: Sports Medicine

## 2023-06-04 ENCOUNTER — Ambulatory Visit (INDEPENDENT_AMBULATORY_CARE_PROVIDER_SITE_OTHER): Payer: Medicare HMO

## 2023-06-04 DIAGNOSIS — M170 Bilateral primary osteoarthritis of knee: Secondary | ICD-10-CM | POA: Diagnosis not present

## 2023-06-04 DIAGNOSIS — M1711 Unilateral primary osteoarthritis, right knee: Secondary | ICD-10-CM | POA: Diagnosis not present

## 2023-06-04 DIAGNOSIS — M1712 Unilateral primary osteoarthritis, left knee: Secondary | ICD-10-CM | POA: Diagnosis not present

## 2023-06-04 MED ORDER — DICLOFENAC SODIUM 1 % EX GEL: 4.0000 g | Freq: Four times a day (QID) | CUTANEOUS | 11 refills | Status: DC

## 2023-06-04 NOTE — Telephone Encounter (Signed)
Orthovisc approval please, x-ray confirmed bilateral knee osteoarthritis, bilateral Orthovisc, has failed NSAIDs, analgesics, greater than 6 weeks of physical therapy.

## 2023-06-04 NOTE — Assessment & Plan Note (Signed)
This is a very pleasant 67 year old female, she has a history of knee osteoarthritis, she has done well with Orthovisc injections about 5 years ago, now having recurrence of pain. She tried some frankincense oil but this is insufficient, we will add Voltaren gel, we will also work on getting her approved for Orthovisc. When we do get her approved we will do Orthovisc and steroid at the same time for the first shot, I would like updated x-rays today. Return to see me when approved.

## 2023-06-04 NOTE — Progress Notes (Signed)
    Procedures performed today:    None.  Independent interpretation of notes and tests performed by another provider:   None.  Brief History, Exam, Impression, and Recommendations:    Primary osteoarthritis of both knees This is a very pleasant 67 year old female, she has a history of knee osteoarthritis, she has done well with Orthovisc injections about 5 years ago, now having recurrence of pain. She tried some frankincense oil but this is insufficient, we will add Voltaren gel, we will also work on getting her approved for Orthovisc. When we do get her approved we will do Orthovisc and steroid at the same time for the first shot, I would like updated x-rays today. Return to see me when approved.    ____________________________________________ Ihor Austin. Benjamin Stain, M.D., ABFM., CAQSM., AME. Primary Care and Sports Medicine [facility]  Adjunct Professor of Family Medicine  [place] [employer] [facility] [employer]  Restaurant manager, fast food

## 2023-06-05 NOTE — Telephone Encounter (Signed)
PA information submitted via [email or web address] for Orthovisc Paperwork has been printed and given to Dr. T for signatures. Once obtained, information will be faxed to [employer] at [phone]  

## 2023-06-12 NOTE — Telephone Encounter (Signed)
Benefits Investigation Details received from [employer] Injection: Orthovisc May fill through: [facility] and [facility] Copay/Coinsurance: 0% Product Copay: 20% Administration Coinsurance: 0% Administration Copay: $10 Out of Pocket Max: $3600 (met: $20)

## 2023-06-12 NOTE — Telephone Encounter (Signed)
Patient notified of cost and approval and she states will call and get scheduled.

## 2023-06-29 ENCOUNTER — Ambulatory Visit (INDEPENDENT_AMBULATORY_CARE_PROVIDER_SITE_OTHER): Payer: Medicare HMO | Admitting: Family Medicine

## 2023-06-29 ENCOUNTER — Encounter: Payer: Self-pay | Admitting: Family Medicine

## 2023-06-29 VITALS — BP 132/76 | HR 81 | Temp 98.0°F | Ht 61.0 in | Wt 132.0 lb

## 2023-06-29 DIAGNOSIS — J029 Acute pharyngitis, unspecified: Secondary | ICD-10-CM | POA: Diagnosis not present

## 2023-06-29 DIAGNOSIS — F411 Generalized anxiety disorder: Secondary | ICD-10-CM

## 2023-06-29 DIAGNOSIS — F4321 Adjustment disorder with depressed mood: Secondary | ICD-10-CM

## 2023-06-29 DIAGNOSIS — R7989 Other specified abnormal findings of blood chemistry: Secondary | ICD-10-CM | POA: Diagnosis not present

## 2023-06-29 DIAGNOSIS — F5101 Primary insomnia: Secondary | ICD-10-CM | POA: Diagnosis not present

## 2023-06-29 DIAGNOSIS — R7301 Impaired fasting glucose: Secondary | ICD-10-CM | POA: Diagnosis not present

## 2023-06-29 DIAGNOSIS — J449 Chronic obstructive pulmonary disease, unspecified: Secondary | ICD-10-CM | POA: Diagnosis not present

## 2023-06-29 LAB — POCT GLYCOSYLATED HEMOGLOBIN (HGB A1C): Hemoglobin A1C: 5.8 % — AB (ref 4.0–5.6)

## 2023-06-29 NOTE — Assessment & Plan Note (Signed)
A1C looks great today at 5.8 which is down from prior of 6.1.

## 2023-06-29 NOTE — Assessment & Plan Note (Signed)
Continue Xanax for now but I still want to eventually wean her off.  She did look into the Belsomra but the co-pay was gone to be too high.  And she had a concern about the trazodone as well but could not quite remember exactly what it was.

## 2023-06-29 NOTE — Progress Notes (Signed)
Established Patient Office Visit  Subjective   Patient ID: Ann Guerrero, female    DOB: January 06, 1956  Age: 67 y.o. MRN: [id number]  Chief Complaint  Patient presents with   ifg    HPI  Impaired fasting glucose-no increased thirst or urination. No symptoms consistent with hypoglycemia.  Her mother passed away last week and that has been stressful. The funeral is this weekend.   Also c/ of fatigue and ST. she says starting last Wednesday after she found out that her mother passed away she had been crying a lot had a pretty severe headache.  And then the next day noticed that she just felt really exhausted and had a sore throat just on the right side.  Saturday morning she said she woke up feeling a little better but then by that night the fatigue hit her again and she checked her temperature and it was 100.3.  She has had a mild cough and some postnasal drip.  She still has a sore throat just on the right side it is very localized.  It is mild.  She did take a couple Tylenol and it did help her sleep.  She has had a mild headache intermittently since the night she had a more severe headache.    ROS    Objective:     BP 132/76   Pulse 81   Temp 98 F (36.7 C)   Ht 5\' 1"  (1.549 m)   Wt 132 lb (59.9 kg)   SpO2 95%   BMI 24.94 kg/m    Physical Exam Constitutional:      Appearance: Normal appearance. [name] well-developed.  HENT:     Head: Normocephalic and atraumatic.     Right Ear: Tympanic membrane, ear canal and external ear normal.     Left Ear: Tympanic membrane, ear canal and external ear normal.     Nose: Nose normal.     Mouth/Throat:     Pharynx: Oropharynx is clear.  Eyes:     Conjunctiva/sclera: Conjunctivae normal.     Pupils: Pupils are equal, round, and reactive to light.  Neck:     Thyroid: No thyromegaly.  Cardiovascular:     Rate and Rhythm: Normal rate and regular rhythm.     Heart sounds: Normal heart sounds.  Pulmonary:     Effort: Pulmonary  effort is normal.     Breath sounds: Normal breath sounds. No wheezing.  Musculoskeletal:     Cervical back: Neck supple.  Lymphadenopathy:     Cervical: No cervical adenopathy.  Skin:    General: Skin is warm and dry.  Neurological:     Mental Status: [name] alert and oriented to person, place, and time.      Results for orders placed or performed in visit on 06/29/23  POCT glycosylated hemoglobin (Hb A1C)  Result Value Ref Range   Hemoglobin A1C 5.8 (A) 4.0 - 5.6 %   HbA1c POC (<> result, manual entry)     HbA1c, POC (prediabetic range)     HbA1c, POC (controlled diabetic range)        The 10-year ASCVD risk score (Arnett DK, et al., 2019) is: 7.6%    Assessment & Plan:   Problem List Items Addressed This Visit       Endocrine   IFG (impaired fasting glucose) - Primary    A1C looks great today at 5.8 which is down from prior of 6.1.      Relevant Orders  POCT glycosylated hemoglobin (Hb A1C) (Completed)   COMPLETE METABOLIC PANEL WITH GFR   Lipid Panel w/reflex Direct LDL     Other   INSOMNIA    Continue Xanax for now but I still want to eventually wean her off.  She did look into the Belsomra but the co-pay was gone to be too high.  And she had a concern about the trazodone as well but could not quite remember exactly what it was.      Relevant Orders   COMPLETE METABOLIC PANEL WITH GFR   Lipid Panel w/reflex Direct LDL   Anxiety state   Relevant Orders   COMPLETE METABOLIC PANEL WITH GFR   Lipid Panel w/reflex Direct LDL   Other Visit Diagnoses     Abnormal TSH       Relevant Orders   TSH   Sore throat       Grief          Upper respiratory infection-she is got cough postnasal drip and mild right-sided sore throat.  Recommend continue with symptomatic care including the salt water gargles and Tylenol.  If she is not better by the end of the week or develops new or worsening symptoms then please let us know.  Grief - call if needs any help.    Return in about 6 months (around 12/30/2023) for Anxiety and , Pre-diabetes.    Nani Gasser, MD

## 2023-06-30 LAB — COMPLETE METABOLIC PANEL WITH GFR
AG Ratio: 1.3 (calc) (ref 1.0–2.5)
ALT: 11 U/L (ref 6–29)
AST: 16 U/L (ref 10–35)
Albumin: 4 g/dL (ref 3.6–5.1)
Alkaline phosphatase (APISO): 80 U/L (ref 37–153)
BUN: 10 mg/dL (ref 7–25)
CO2: 29 mmol/L (ref 20–32)
Calcium: 9.5 mg/dL (ref 8.6–10.4)
Chloride: 98 mmol/L (ref 98–110)
Creat: 0.61 mg/dL (ref 0.50–1.05)
Globulin: 3.2 g/dL (calc) (ref 1.9–3.7)
Glucose, Bld: 89 mg/dL (ref 65–99)
Potassium: 4.3 mmol/L (ref 3.5–5.3)
Sodium: 137 mmol/L (ref 135–146)
Total Bilirubin: 0.5 mg/dL (ref 0.2–1.2)
Total Protein: 7.2 g/dL (ref 6.1–8.1)
eGFR: 97 mL/min/{1.73_m2} (ref >=60)

## 2023-06-30 LAB — LIPID PANEL W/REFLEX DIRECT LDL
Cholesterol: 155 mg/dL (ref <200)
HDL: 49 mg/dL — ABNORMAL LOW (ref >=50)
LDL Cholesterol (Calc): 89 mg/dL (calc)
Non-HDL Cholesterol (Calc): 106 mg/dL (calc) (ref <130)
Total CHOL/HDL Ratio: 3.2 (calc) (ref <5.0)
Triglycerides: 81 mg/dL (ref <150)

## 2023-06-30 LAB — TSH: TSH: 1.75 mIU/L (ref 0.40–4.50)

## 2023-06-30 NOTE — Progress Notes (Signed)
Hi Kassiah, your LDL looks a little better. Your HDL has dropped. This often decreases with decreased exercise. All other labs look great!

## 2023-06-30 NOTE — Addendum Note (Signed)
Addended by: Kalvin Buss D on: 06/30/2023 06:07 PM   Modules accepted: Level of Service  

## 2023-07-09 ENCOUNTER — Other Ambulatory Visit: Payer: Self-pay | Admitting: Family Medicine

## 2023-07-09 DIAGNOSIS — F411 Generalized anxiety disorder: Secondary | ICD-10-CM

## 2023-07-09 DIAGNOSIS — F5101 Primary insomnia: Secondary | ICD-10-CM

## 2023-07-09 MED ORDER — ALPRAZOLAM 1 MG PO TABS: ORAL_TABLET | ORAL | 1 refills | Status: DC

## 2023-07-09 NOTE — Telephone Encounter (Signed)
Pt informed that medication was sent

## 2023-07-09 NOTE — Telephone Encounter (Signed)
Pt called. [name] following up on pharmacy refill request on Alprazolam.

## 2023-09-09 ENCOUNTER — Other Ambulatory Visit: Payer: Self-pay | Admitting: Family Medicine

## 2023-09-09 DIAGNOSIS — F411 Generalized anxiety disorder: Secondary | ICD-10-CM

## 2023-09-09 DIAGNOSIS — F5101 Primary insomnia: Secondary | ICD-10-CM

## 2023-10-05 ENCOUNTER — Ambulatory Visit (INDEPENDENT_AMBULATORY_CARE_PROVIDER_SITE_OTHER): Payer: Medicare HMO | Admitting: Family Medicine

## 2023-10-05 ENCOUNTER — Telehealth: Payer: Self-pay | Admitting: General Practice

## 2023-10-05 DIAGNOSIS — Z0000 Encounter for general adult medical examination without abnormal findings: Secondary | ICD-10-CM | POA: Diagnosis not present

## 2023-10-05 NOTE — Telephone Encounter (Signed)
Error
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
## 2023-10-05 NOTE — Patient Instructions (Addendum)
MEDICARE ANNUAL WELLNESS VISIT Health Maintenance Summary and Written Plan of Care  [name] ,  Thank you for allowing me to perform your Medicare Annual Wellness Visit and for your ongoing commitment to your health.   Health Maintenance & Immunization History Health Maintenance  Topic Date Due   DTaP/Tdap/Td (2 - Td or Tdap) 09/03/2023   COVID-19 Vaccine (1 - 2023-24 season) 10/21/2023 (Originally 08/30/2023)   Lung Cancer Screening  12/27/2023 (Originally 10/16/2004)   Zoster Vaccines- Shingrix (1 of 2) 01/05/2024 (Originally 10/16/2004)   INFLUENZA VACCINE  03/28/2024 (Originally 07/30/2023)   Pneumonia Vaccine 55+ Years old (1 of 1 - PCV) 10/04/2024 (Originally 10/17/2019)   Medicare Annual Wellness (AWV)  10/04/2024   MAMMOGRAM  10/29/2024   DEXA SCAN  10/29/2024   Fecal DNA (Cologuard)  10/13/2025   Hepatitis C Screening  Completed   HPV VACCINES  Aged Out   Colonoscopy  Discontinued   Immunization History  Administered Date(s) Administered   Tdap 09/02/2013    These are the patient goals that we discussed:  Goals Addressed               This Visit's Progress     Patient Stated (pt-stated)        Patient stated that she would like maintain her current lifestyle.         This is a list of Health Maintenance Items that are overdue or due now: Pneumococcal vaccine  Influenza vaccine Td vaccine Shingles vaccine Mammogram- Due November. - declined. Dexa scan- due in November, 2025   Patient declined influenza, pneumonia and shingles vaccine.   Orders/Referrals Placed Today: No orders of the defined types were placed in this encounter.  (Contact our referral department at [phone] if you have not spoken with someone about your referral appointment within the next 5 days)    Follow-up Plan Follow-up with Agapito Games, MD as planned Schedule td vaccine at the pharmacy.  Patient will access AVS on my chart.      Health Maintenance,  Female Adopting a healthy lifestyle and getting preventive care are important in promoting health and wellness. Ask your health care provider about: The right schedule for you to have regular tests and exams. Things you can do on your own to prevent diseases and keep yourself healthy. What should I know about diet, weight, and exercise? Eat a healthy diet  Eat a diet that includes plenty of vegetables, fruits, low-fat dairy products, and lean protein. Do not eat a lot of foods that are high in solid fats, added sugars, or sodium. Maintain a healthy weight Body mass index (BMI) is used to identify weight problems. It estimates body fat based on height and weight. Your health care provider can help determine your BMI and help you achieve or maintain a healthy weight. Get regular exercise Get regular exercise. This is one of the most important things you can do for your health. Most adults should: Exercise for at least 150 minutes each week. The exercise should increase your heart rate and make you sweat (moderate-intensity exercise). Do strengthening exercises at least twice a week. This is in addition to the moderate-intensity exercise. Spend less time sitting. Even light physical activity can be beneficial. Watch cholesterol and blood lipids Have your blood tested for lipids and cholesterol at 69 years of age, then have this test every 5 years. Have your cholesterol levels checked more often if: Your lipid or cholesterol levels are high. You are older than 69  years of age. You are at high risk for heart disease. What should I know about cancer screening? Depending on your health history and family history, you may need to have cancer screening at various ages. This may include screening for: Breast cancer. Cervical cancer. Colorectal cancer. Skin cancer. Lung cancer. What should I know about heart disease, diabetes, and high blood pressure? Blood pressure and heart disease High blood  pressure causes heart disease and increases the risk of stroke. This is more likely to develop in people who have high blood pressure readings or are overweight. Have your blood pressure checked: Every 3-5 years if you are 85-85 years of age. Every year if you are 29 years old or older. Diabetes Have regular diabetes screenings. This checks your fasting blood sugar level. Have the screening done: Once every three years after age 65 if you are at a normal weight and have a low risk for diabetes. More often and at a younger age if you are overweight or have a high risk for diabetes. What should I know about preventing infection? Hepatitis B If you have a higher risk for hepatitis B, you should be screened for this virus. Talk with your health care provider to find out if you are at risk for hepatitis B infection. Hepatitis C Testing is recommended for: Everyone born from [birth year] through 1967. Anyone with known risk factors for hepatitis C. Sexually transmitted infections (STIs) Get screened for STIs, including gonorrhea and chlamydia, if: You are sexually active and are younger than 69 years of age. You are older than 69 years of age and your health care provider tells you that you are at risk for this type of infection. Your sexual activity has changed since you were last screened, and you are at increased risk for chlamydia or gonorrhea. Ask your health care provider if you are at risk. Ask your health care provider about whether you are at high risk for HIV. Your health care provider may recommend a prescription medicine to help prevent HIV infection. If you choose to take medicine to prevent HIV, you should first get tested for HIV. You should then be tested every 3 months for as long as you are taking the medicine. Pregnancy If you are about to stop having your period (premenopausal) and you may become pregnant, seek counseling before you get pregnant. Take 400 to 800 micrograms (mcg) of folic  acid every day if you become pregnant. Ask for birth control (contraception) if you want to prevent pregnancy. Osteoporosis and menopause Osteoporosis is a disease in which the bones lose minerals and strength with aging. This can result in bone fractures. If you are 87 years old or older, or if you are at risk for osteoporosis and fractures, ask your health care provider if you should: Be screened for bone loss. Take a calcium or vitamin D supplement to lower your risk of fractures. Be given hormone replacement therapy (HRT) to treat symptoms of menopause. Follow these instructions at home: Alcohol use Do not drink alcohol if: Your health care provider tells you not to drink. You are pregnant, may be pregnant, or are planning to become pregnant. If you drink alcohol: Limit how much you have to: 0-1 drink a day. Know how much alcohol is in your drink. In the [place] one drink equals one 12 oz bottle of beer (355 mL), one 5 oz glass of wine (148 mL), or one 1 oz glass of hard liquor (44 mL). Lifestyle Do not use  any products that contain nicotine or tobacco. These products include cigarettes, chewing tobacco, and vaping devices, such as e-cigarettes. If you need help quitting, ask your health care provider. Do not use street drugs. Do not share needles. Ask your health care provider for help if you need support or information about quitting drugs. General instructions Schedule regular health, dental, and eye exams. Stay current with your vaccines. Tell your health care provider if: You often feel depressed. You have ever been abused or do not feel safe at home. Summary Adopting a healthy lifestyle and getting preventive care are important in promoting health and wellness. Follow your health care provider's instructions about healthy diet, exercising, and getting tested or screened for diseases. Follow your health care provider's instructions on monitoring your cholesterol and blood  pressure. This information is not intended to replace advice given to you by your health care provider. Make sure you discuss any questions you have with your health care provider. Document Revised: 05/06/2021 Document Reviewed: 05/06/2021 Elsevier Patient Education  2024 [employer].

## 2023-11-09 ENCOUNTER — Other Ambulatory Visit: Payer: Self-pay | Admitting: Family Medicine

## 2023-11-09 DIAGNOSIS — F5101 Primary insomnia: Secondary | ICD-10-CM

## 2023-11-09 DIAGNOSIS — F411 Generalized anxiety disorder: Secondary | ICD-10-CM

## 2023-12-16 DIAGNOSIS — H52223 Regular astigmatism, bilateral: Secondary | ICD-10-CM | POA: Diagnosis not present

## 2023-12-16 DIAGNOSIS — H524 Presbyopia: Secondary | ICD-10-CM | POA: Diagnosis not present

## 2023-12-31 ENCOUNTER — Encounter: Payer: Self-pay | Admitting: Family Medicine

## 2023-12-31 ENCOUNTER — Ambulatory Visit (INDEPENDENT_AMBULATORY_CARE_PROVIDER_SITE_OTHER): Payer: Medicare HMO | Admitting: Family Medicine

## 2023-12-31 VITALS — BP 131/81 | HR 66 | Ht 61.0 in | Wt 142.0 lb

## 2023-12-31 DIAGNOSIS — R7301 Impaired fasting glucose: Secondary | ICD-10-CM | POA: Diagnosis not present

## 2023-12-31 DIAGNOSIS — F5101 Primary insomnia: Secondary | ICD-10-CM

## 2023-12-31 DIAGNOSIS — F411 Generalized anxiety disorder: Secondary | ICD-10-CM | POA: Diagnosis not present

## 2023-12-31 LAB — POCT GLYCOSYLATED HEMOGLOBIN (HGB A1C): Hemoglobin A1C: 6 % — AB (ref 4.0–5.6)

## 2023-12-31 MED ORDER — ALPRAZOLAM 1 MG PO TABS: 1.0000 mg | ORAL_TABLET | Freq: Every evening | ORAL | 1 refills | Status: DC | PRN

## 2023-12-31 NOTE — Patient Instructions (Signed)
Get your tetanus vaccine done at the pharmacy

## 2023-12-31 NOTE — Assessment & Plan Note (Signed)
 A1C up just slightly from previous at 6.0.  Encouraged her to work on continue to cut back on sweets and carbs.  Continue to work on increasing activity and exercise level.  She has not been able to swim like she used to for exercise.

## 2023-12-31 NOTE — Assessment & Plan Note (Signed)
 Oertli using alprazolam.  Had side effects with Ambien and Lunesta.  Refill sent to mail order pharmacy.  Follow-up in 6 months.
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
## 2023-12-31 NOTE — Assessment & Plan Note (Signed)
 Still struggling with some increase stressors and anxiety mostly uses the alprazolam at bedtime to help her rest.  Not currently on an SSRI.  PHQ-9 score of 8 and GAD-7 score 4.

## 2024-04-21 DIAGNOSIS — H43812 Vitreous degeneration, left eye: Secondary | ICD-10-CM | POA: Diagnosis not present

## 2024-04-21 DIAGNOSIS — H52223 Regular astigmatism, bilateral: Secondary | ICD-10-CM | POA: Diagnosis not present

## 2024-04-21 DIAGNOSIS — H524 Presbyopia: Secondary | ICD-10-CM | POA: Diagnosis not present

## 2024-04-21 DIAGNOSIS — Z961 Presence of intraocular lens: Secondary | ICD-10-CM | POA: Diagnosis not present

## 2024-04-22 ENCOUNTER — Ambulatory Visit (INDEPENDENT_AMBULATORY_CARE_PROVIDER_SITE_OTHER): Admitting: Family Medicine

## 2024-04-22 ENCOUNTER — Encounter: Payer: Self-pay | Admitting: Family Medicine

## 2024-04-22 VITALS — BP 120/70 | HR 62 | Ht 61.0 in | Wt 143.0 lb

## 2024-04-22 DIAGNOSIS — R040 Epistaxis: Secondary | ICD-10-CM | POA: Insufficient documentation

## 2024-04-22 MED ORDER — MUPIROCIN 2 % EX OINT: TOPICAL_OINTMENT | CUTANEOUS | 0 refills | Status: DC

## 2024-04-22 NOTE — Patient Instructions (Addendum)
 Can use cotton ball soaked with Affrin to slow nose bleeding.  Ice pack can help too.

## 2024-04-22 NOTE — Progress Notes (Signed)
 Acute Office Visit  Subjective:     Patient ID: Ann Guerrero, female    DOB: 08-21-1956, 68 y.o.   MRN: [id number]  Chief Complaint  Patient presents with   Epistaxis    Pt reports that she has been experiencing nose bleeds for the past 3 weeks. She said that she will have this happen to her 5 out of 7 days and that it will take up to 2 hours for it to stop. She has noticed large clots that either come from her nose or out of her mouth from her throat. She denies blowing her nose hard or causing anything traumatic that may cause this to start.     HPI Patient is in today for   Pt reports that she has been experiencing nose bleeds for the past 3 weeks. She said that she will have this happen to her 5 out of 7 days and that it will take up to 2 hours for it to stop. She has noticed large clots that either come from her nose or out of her mouth from her throat. She denies blowing her nose hard or causing anything traumatic that may cause this to start.  Denies any recent nasal or sinus irritation such as illness or allergies.    ROS      Objective:    BP 120/70 (BP Location: Left Arm, Cuff Size: Normal)   Pulse 62   Ht 5\' 1"  (1.549 m)   Wt 143 lb (64.9 kg)   SpO2 99%   BMI 27.02 kg/m    Physical Exam Vitals reviewed.  Constitutional:      Appearance: Normal appearance.  HENT:     Head: Normocephalic.     Nose: No congestion or rhinorrhea.     Comments: Not see any active bleeding or ulcerations.    Mouth/Throat:     Mouth: Mucous membranes are moist.  Pulmonary:     Effort: Pulmonary effort is normal.  Neurological:     Mental Status: [name] alert and oriented to person, place, and time.  Psychiatric:        Mood and Affect: Mood normal.        Behavior: Behavior normal.     No results found for any visits on 04/22/24.      Assessment & Plan:   Problem List Items Addressed This Visit   None Visit Diagnoses       Nosebleed    -  Primary   Relevant  Orders   Ambulatory referral to ENT   CBC with Differential/Platelet   CMP14+EGFR      I discussed some options such as using Afrin soaked cottonball when it bleeds especially since she has had a hard time getting it to stop lately.  Ice packs can help as well.  I did not see a source of bleeding that I could cauterize here in the office today so we will refer to ENT for further evaluation.  In the short-term I am going to give her some mupirocin  ointment to apply twice a day to help moisturize the area as she is already been running her humidifier as well.  [name] not having any sinus symptoms or irritation.  And says she does not blow her nose frequently or excessively.  Meds ordered this encounter  Medications   mupirocin  ointment (BACTROBAN ) 2 %    Sig: Apply to inside of each nares twice daily for 10 days    Dispense:  30  g    Refill:  0    No follow-ups on file.  Duaine German, MD

## 2024-04-23 LAB — CMP14+EGFR
ALT: 20 IU/L (ref 0–32)
AST: 23 IU/L (ref 0–40)
Albumin: 4 g/dL (ref 3.9–4.9)
Alkaline Phosphatase: 76 IU/L (ref 44–121)
BUN/Creatinine Ratio: 19 (ref 12–28)
BUN: 13 mg/dL (ref 8–27)
Bilirubin Total: 0.4 mg/dL (ref 0.0–1.2)
CO2: 24 mmol/L (ref 20–29)
Calcium: 9.4 mg/dL (ref 8.7–10.3)
Chloride: 100 mmol/L (ref 96–106)
Creatinine, Ser: 0.7 mg/dL (ref 0.57–1.00)
Globulin, Total: 2.4 g/dL (ref 1.5–4.5)
Glucose: 93 mg/dL (ref 70–99)
Potassium: 4.3 mmol/L (ref 3.5–5.2)
Sodium: 139 mmol/L (ref 134–144)
Total Protein: 6.4 g/dL (ref 6.0–8.5)
eGFR: 94 mL/min/{1.73_m2} (ref >59)

## 2024-04-23 LAB — CBC WITH DIFFERENTIAL/PLATELET
Basophils Absolute: 0.1 10*3/uL (ref 0.0–0.2)
Basos: 1 %
EOS (ABSOLUTE): 0.2 10*3/uL (ref 0.0–0.4)
Eos: 2 %
Hematocrit: 42.1 % (ref 34.0–46.6)
Hemoglobin: 13.6 g/dL (ref 11.1–15.9)
Immature Grans (Abs): 0 10*3/uL (ref 0.0–0.1)
Immature Granulocytes: 0 %
Lymphocytes Absolute: 2 10*3/uL (ref 0.7–3.1)
Lymphs: 29 %
MCH: 30.6 pg (ref 26.6–33.0)
MCHC: 32.3 g/dL (ref 31.5–35.7)
MCV: 95 fL (ref 79–97)
Monocytes Absolute: 0.7 10*3/uL (ref 0.1–0.9)
Monocytes: 9 %
Neutrophils Absolute: 4 10*3/uL (ref 1.4–7.0)
Neutrophils: 59 %
Platelets: 304 10*3/uL (ref 150–450)
RBC: 4.45 x10E6/uL (ref 3.77–5.28)
RDW: 12.5 % (ref 11.7–15.4)
WBC: 6.9 10*3/uL (ref 3.4–10.8)

## 2024-04-25 ENCOUNTER — Encounter: Payer: Self-pay | Admitting: Family Medicine

## 2024-04-25 ENCOUNTER — Telehealth: Payer: Self-pay | Admitting: Sports Medicine

## 2024-04-25 NOTE — Progress Notes (Signed)
 Your lab work is within acceptable range and there are no concerning findings.   ?

## 2024-04-25 NOTE — Telephone Encounter (Signed)
 Copied from CRM [id number]. Topic: General - Billing Inquiry >> Apr 25, 2024 12:09 PM Ann Guerrero wrote: Reason for CRM: The patient called this afternoon in regards to a billing issue with her Gel Injections that she receives routinely for her constant knee pain. She states she was previously advised of a $150 co-pay per visit for this injection. She states she usually receives this every 7 days and has never had any issues, since her previous approval in June 2024. She spoke with her insurance [employer] and was advised she should not have to make any out-of-pocket payments to have this procedure completed. I provided the [facility] Department #, the patient is wanting to confirm if she would have to schedule an appointment to discuss this further? Callback #: [phone]

## 2024-04-25 NOTE — Telephone Encounter (Signed)
 I have reached out to the patient via phone call. Patient states not understanding the reason behind $150 CoPay for Orthovisc injections. I have explained [employer] has advised Orthovisc Prior Auth team of a 20% Product Copay and a $10 Administration CoPay; which would equal the $150. Patient states thinking something was incorrectly documented due to previously, in 2018, having only had a $40 Office Visit Copay alongside a 20% Coinsurance. I have advised the patient I would be looking into her approval process from 06/08/2023 to be sure everything was entered correctly. I also advised I would be reaching out to [facility] to follow-up on coverage.

## 2024-05-18 NOTE — Telephone Encounter (Signed)
 I have contacted patient in regards to ORTHOVISC injections. Patient has been advised of $150 copay. PA is not required for [id number] through [facility] HMO.  Patient stated she will be beginning injections 05/19/2024. She has also mentioned she would like to hold off on in-office payments and receive a bill in the mail instead.

## 2024-05-19 ENCOUNTER — Other Ambulatory Visit (INDEPENDENT_AMBULATORY_CARE_PROVIDER_SITE_OTHER)

## 2024-05-19 ENCOUNTER — Ambulatory Visit: Admitting: Sports Medicine

## 2024-05-19 DIAGNOSIS — M170 Bilateral primary osteoarthritis of knee: Secondary | ICD-10-CM

## 2024-05-19 MED ORDER — HYALURONAN 30 MG/2ML IX SOSY
30.0000 mg | PREFILLED_SYRINGE | Freq: Once | INTRA_ARTICULAR | Status: AC
  Administered 2024-05-19: 30 mg via INTRA_ARTICULAR

## 2024-05-19 NOTE — Assessment & Plan Note (Addendum)
 Known bilateral knee osteoarthritis, we have not treated her in some time, today we made the decision to restart Orthovisc, today we did an aspiration and injection of the left knee, injection of the right knee, return in 1 week for #2 of 4. At the follow-up visit if she still having significant pain we can do steroid and Orthovisc together.

## 2024-05-19 NOTE — Progress Notes (Signed)
    Procedures performed today:    Procedure: Real-time Ultrasound Guided aspiration/injection of the left knee Device: Samsung HS60  Verbal informed consent obtained.  Time-out conducted.  Noted no overlying erythema, induration, or other signs of local infection.  Skin prepped in a sterile fashion.  Local anesthesia: Topical Ethyl chloride.  With sterile technique and under real time ultrasound guidance: Effusion noted, using a 18-gauge needle I aspirated 12 mL of clear, straw-colored fluid, syringe switched and 30 mg/2 mL of OrthoVisc (sodium hyaluronate) in a prefilled syringe was injected easily into the knee. Completed without difficulty  Advised to call if fevers/chills, erythema, induration, drainage, or persistent bleeding.  Images permanently stored and available for review in PACS.  Impression: Technically successful ultrasound guided aspiration/injection.  Procedure: Real-time Ultrasound Guided injection of the right knee Device: Samsung HS60  Verbal informed consent obtained.  Time-out conducted.  Noted no overlying erythema, induration, or other signs of local infection.  Skin prepped in a sterile fashion.  Local anesthesia: Topical Ethyl chloride.  With sterile technique and under real time ultrasound guidance: Trace effusion noted, 30 mg/2 mL of OrthoVisc (sodium hyaluronate) in a prefilled syringe was injected easily into the knee through a 22-gauge needle. Completed without difficulty  Advised to call if fevers/chills, erythema, induration, drainage, or persistent bleeding.  Images permanently stored and available for review in PACS.  Impression: Technically successful ultrasound guided injection.  Independent interpretation of notes and tests performed by another provider:   None.  Brief History, Exam, Impression, and Recommendations:    Primary osteoarthritis of both knees Known bilateral knee osteoarthritis, we have not treated her in some time, today we made  the decision to restart Orthovisc, today we did an aspiration and injection of the left knee, injection of the right knee, return in 1 week for #2 of 4. At the follow-up visit if she still having significant pain we can do steroid and Orthovisc together.    ____________________________________________ Ann Guerrero. Sandy Crumb, M.D., ABFM., CAQSM., AME. Primary Care and Sports Medicine [facility]  Adjunct Professor of [facility] Medicine  [employer]  Restaurant manager, fast food

## 2024-05-20 ENCOUNTER — Ambulatory Visit: Admitting: Sports Medicine

## 2024-05-26 ENCOUNTER — Ambulatory Visit (INDEPENDENT_AMBULATORY_CARE_PROVIDER_SITE_OTHER): Admitting: Sports Medicine

## 2024-05-26 ENCOUNTER — Other Ambulatory Visit (INDEPENDENT_AMBULATORY_CARE_PROVIDER_SITE_OTHER)

## 2024-05-26 DIAGNOSIS — M170 Bilateral primary osteoarthritis of knee: Secondary | ICD-10-CM

## 2024-05-26 MED ORDER — HYALURONAN 30 MG/2ML IX SOSY
30.0000 mg | PREFILLED_SYRINGE | Freq: Once | INTRA_ARTICULAR | Status: AC
  Administered 2024-05-26: 30 mg via INTRA_ARTICULAR

## 2024-05-26 NOTE — Assessment & Plan Note (Signed)
Orthovisc 2 of 4 both knees, return in 1 week for #3 of 4. 

## 2024-05-26 NOTE — Progress Notes (Signed)
    Procedures performed today:    Procedure: Real-time Ultrasound Guided injection of the left knee Device: Samsung HS60  Verbal informed consent obtained.  Time-out conducted.  Noted no overlying erythema, induration, or other signs of local infection.  Skin prepped in a sterile fashion.  Local anesthesia: Topical Ethyl chloride.  With sterile technique and under real time ultrasound guidance: Trace effusion noted, 30 mg/2 mL of OrthoVisc (sodium hyaluronate) in a prefilled syringe was injected easily into the knee through a 22-gauge needle. Advised to call if fevers/chills, erythema, induration, drainage, or persistent bleeding.  Images permanently stored and available for review in PACS.  Impression: Technically successful ultrasound guided injection.     Procedure: Real-time Ultrasound Guided injection of the right knee Device: Samsung HS60  Verbal informed consent obtained.  Time-out conducted.  Noted no overlying erythema, induration, or other signs of local infection.  Skin prepped in a sterile fashion.  Local anesthesia: Topical Ethyl chloride.  With sterile technique and under real time ultrasound guidance: Trace effusion noted, 30 mg/2 mL of OrthoVisc (sodium hyaluronate) in a prefilled syringe was injected easily into the knee through a 22-gauge needle. Advised to call if fevers/chills, erythema, induration, drainage, or persistent bleeding.  Images permanently stored and available for review in PACS.  Impression: Technically successful ultrasound guided injection.  Independent interpretation of notes and tests performed by another provider:   None.  Brief History, Exam, Impression, and Recommendations:    Primary osteoarthritis of both knees Orthovisc 2 of 4 both knees, return in 1 week for #3 of 4.    ____________________________________________ Joselyn Nicely. Sandy Crumb, M.D., ABFM., CAQSM., AME. Primary Care and Sports Medicine [facility]  [facility]  Adjunct Professor of [facility] Medicine  [employer]  Restaurant manager, fast food

## 2024-06-02 ENCOUNTER — Encounter: Payer: Self-pay | Admitting: Sports Medicine

## 2024-06-02 ENCOUNTER — Other Ambulatory Visit (INDEPENDENT_AMBULATORY_CARE_PROVIDER_SITE_OTHER)

## 2024-06-02 ENCOUNTER — Ambulatory Visit (INDEPENDENT_AMBULATORY_CARE_PROVIDER_SITE_OTHER): Admitting: Sports Medicine

## 2024-06-02 DIAGNOSIS — M170 Bilateral primary osteoarthritis of knee: Secondary | ICD-10-CM | POA: Diagnosis not present

## 2024-06-02 MED ORDER — HYALURONAN 30 MG/2ML IX SOSY
30.0000 mg | PREFILLED_SYRINGE | Freq: Once | INTRA_ARTICULAR | Status: AC
  Administered 2024-06-02: 30 mg via INTRA_ARTICULAR

## 2024-06-02 NOTE — Progress Notes (Signed)
    Procedures performed today:    Procedure: Real-time Ultrasound Guided injection of the left knee Device: Samsung HS60  Verbal informed consent obtained.  Time-out conducted.  Noted no overlying erythema, induration, or other signs of local infection.  Skin prepped in a sterile fashion.  Local anesthesia: Topical Ethyl chloride.  With sterile technique and under real time ultrasound guidance: Trace effusion noted, 30 mg/2 mL of OrthoVisc (sodium hyaluronate) in a prefilled syringe was injected easily into the knee through a 22-gauge needle. Advised to call if fevers/chills, erythema, induration, drainage, or persistent bleeding.  Images permanently stored and available for review in PACS.  Impression: Technically successful ultrasound guided injection.   Procedure: Real-time Ultrasound Guided injection of the right knee Device: Samsung HS60  Verbal informed consent obtained.  Time-out conducted.  Noted no overlying erythema, induration, or other signs of local infection.  Skin prepped in a sterile fashion.  Local anesthesia: Topical Ethyl chloride.  With sterile technique and under real time ultrasound guidance: Trace effusion noted, 30 mg/2 mL of OrthoVisc (sodium hyaluronate) in a prefilled syringe was injected easily into the knee through a 22-gauge needle. Advised to call if fevers/chills, erythema, induration, drainage, or persistent bleeding.  Images permanently stored and available for review in PACS.  Impression: Technically successful ultrasound guided injection.  Independent interpretation of notes and tests performed by another provider:   None.  Brief History, Exam, Impression, and Recommendations:    No problem-specific Assessment & Plan notes found for this encounter.    ____________________________________________ Joselyn Nicely. Sandy Crumb, M.D., ABFM., CAQSM., AME. Primary Care and Sports Medicine  [facility]  Adjunct Professor of  [facility] Medicine  [employer]  Restaurant manager, fast food

## 2024-06-02 NOTE — Addendum Note (Signed)
 Addended by: Montgomery Apgar on: 06/02/2024 02:21 PM   Modules accepted: Orders

## 2024-06-02 NOTE — Assessment & Plan Note (Signed)
Orthovisc 3 of 4 both knees, return in 1 week for #4 of 4 

## 2024-06-09 ENCOUNTER — Ambulatory Visit: Admitting: Sports Medicine

## 2024-06-09 ENCOUNTER — Other Ambulatory Visit (INDEPENDENT_AMBULATORY_CARE_PROVIDER_SITE_OTHER)

## 2024-06-09 DIAGNOSIS — M170 Bilateral primary osteoarthritis of knee: Secondary | ICD-10-CM

## 2024-06-09 MED ORDER — TRIAMCINOLONE ACETONIDE 40 MG/ML IJ SUSP
40.0000 mg | Freq: Once | INTRAMUSCULAR | Status: AC
  Administered 2024-06-09: 40 mg via INTRAMUSCULAR

## 2024-06-09 MED ORDER — HYALURONAN 30 MG/2ML IX SOSY
30.0000 mg | PREFILLED_SYRINGE | Freq: Once | INTRA_ARTICULAR | Status: AC
Start: 2024-06-09 — End: 2024-06-09
  Administered 2024-06-09: 30 mg via INTRA_ARTICULAR

## 2024-06-09 NOTE — Assessment & Plan Note (Addendum)
 She did have a twisting injury and has some increasing pain and swelling left knee, we added steroid and Orthovisc for the left knee, we did Orthovisc for 4 into the right knee. Return in 6 weeks as needed.

## 2024-06-09 NOTE — Progress Notes (Signed)
    Procedures performed today:    Procedure: Real-time Ultrasound Guided injection of the left knee Device: Samsung HS60  Verbal informed consent obtained.  Time-out conducted.  Noted no overlying erythema, induration, or other signs of local infection.  Skin prepped in a sterile fashion.  Local anesthesia: Topical Ethyl chloride.  With sterile technique and under real time ultrasound guidance: Trace effusion noted, 1 cc kenalog  40, 2 cc lidocaine, 2 cc bupivacaine injected into the knee, syringe switched and 30 mg/2 mL of OrthoVisc (sodium hyaluronate) in a prefilled syringe was injected easily into the knee through a 22-gauge needle. Advised to call if fevers/chills, erythema, induration, drainage, or persistent bleeding.  Images permanently stored and available for review in PACS.  Impression: Technically successful ultrasound guided injection.   Procedure: Real-time Ultrasound Guided injection of the right knee Device: Samsung HS60  Verbal informed consent obtained.  Time-out conducted.  Noted no overlying erythema, induration, or other signs of local infection.  Skin prepped in a sterile fashion.  Local anesthesia: Topical Ethyl chloride.  With sterile technique and under real time ultrasound guidance: Trace effusion noted, 30 mg/2 mL of OrthoVisc (sodium hyaluronate) in a prefilled syringe was injected easily into the knee through a 22-gauge needle. Advised to call if fevers/chills, erythema, induration, drainage, or persistent bleeding.  Images permanently stored and available for review in PACS.  Impression: Technically successful ultrasound guided injection.  Independent interpretation of notes and tests performed by another provider:   None.  Brief History, Exam, Impression, and Recommendations:    Primary osteoarthritis of both knees She did have a twisting injury and has some increasing pain and swelling left knee, we added steroid and Orthovisc for the left knee, we  did Orthovisc for 4 into the right knee. Return in 6 weeks as needed.    ____________________________________________ Joselyn Nicely. Sandy Crumb, M.D., ABFM., CAQSM., AME. Primary Care and Sports Medicine [facility]  Adjunct Professor of [facility] Medicine  [employer]  Restaurant manager, fast food

## 2024-06-30 ENCOUNTER — Encounter: Payer: Self-pay | Admitting: Family Medicine

## 2024-06-30 ENCOUNTER — Ambulatory Visit (INDEPENDENT_AMBULATORY_CARE_PROVIDER_SITE_OTHER): Payer: Medicare HMO | Admitting: Family Medicine

## 2024-06-30 VITALS — BP 110/72 | HR 78 | Ht 61.0 in | Wt 141.0 lb

## 2024-06-30 DIAGNOSIS — R040 Epistaxis: Secondary | ICD-10-CM

## 2024-06-30 DIAGNOSIS — F5101 Primary insomnia: Secondary | ICD-10-CM | POA: Diagnosis not present

## 2024-06-30 DIAGNOSIS — F411 Generalized anxiety disorder: Secondary | ICD-10-CM

## 2024-06-30 DIAGNOSIS — R7301 Impaired fasting glucose: Secondary | ICD-10-CM | POA: Diagnosis not present

## 2024-06-30 LAB — POCT GLYCOSYLATED HEMOGLOBIN (HGB A1C): Hemoglobin A1C: 5.9 % — AB (ref 4.0–5.6)

## 2024-06-30 MED ORDER — ALPRAZOLAM 1 MG PO TABS: 1.0000 mg | ORAL_TABLET | Freq: Every evening | ORAL | 1 refills | Status: DC | PRN

## 2024-06-30 NOTE — Assessment & Plan Note (Signed)
 Did request refill on her alprazolam  today but would prefer it to be sent to her local pharmacy.  Prescription sent continue to work on using it sparingly.

## 2024-06-30 NOTE — Assessment & Plan Note (Signed)
 Due for 67-month follow-up.  A1c was 5.9 today.  Which is slightly improved from prior of 6.0.  [name] lost a couple pounds since she was last here.

## 2024-06-30 NOTE — Progress Notes (Signed)
   Established Patient Office Visit  Subjective  Patient ID: Ann Guerrero, female    DOB: [birth year]  Age: 68 y.o. MRN: [id number]  Chief Complaint  Patient presents with   ifg    HPI  Impaired fasting glucose-no increased thirst or urination. No symptoms consistent with hypoglycemia.  Has not made it to ENT yet for the nosebleed she had to cancel her appointment.    ROS    Objective:     BP 110/72   Pulse 78   Ht 5' 1 (1.549 m)   Wt 141 lb (64 kg)   SpO2 96%   BMI 26.64 kg/m    Physical Exam Vitals and nursing note reviewed.  Constitutional:      Appearance: Normal appearance.  HENT:     Head: Normocephalic and atraumatic.  Eyes:     Conjunctiva/sclera: Conjunctivae normal.  Cardiovascular:     Rate and Rhythm: Normal rate and regular rhythm.  Pulmonary:     Effort: Pulmonary effort is normal.     Breath sounds: Normal breath sounds.  Skin:    General: Skin is warm and dry.  Neurological:     Mental Status: [name] alert.  Psychiatric:        Mood and Affect: Mood normal.      Results for orders placed or performed in visit on 06/30/24  POCT HgB A1C  Result Value Ref Range   Hemoglobin A1C 5.9 (A) 4.0 - 5.6 %   HbA1c POC (<> result, manual entry)     HbA1c, POC (prediabetic range)     HbA1c, POC (controlled diabetic range)        The 10-year ASCVD risk score (Arnett DK, et al., 2019) is: 6%    Assessment & Plan:   Problem List Items Addressed This Visit       Endocrine   IFG (impaired fasting glucose) - Primary   Due for 19-month follow-up.  A1c was 5.9 today.  Which is slightly improved from prior of 6.0.  [name] lost a couple pounds since she was last here.      Relevant Orders   POCT HgB A1C (Completed)     Other   Right-sided nosebleed   Did have an appoint with the ENT but had to cancel it and has had 3 nosebleeds since then she has not rescheduled yet but encouraged her to do so.  She says they cottonball with Afrin trick  has been helpful though she can usually get it to stop within about 10 minutes if she does that.      INSOMNIA   Relevant Medications   ALPRAZolam  (XANAX ) 1 MG tablet   Anxiety state   Did request refill on her alprazolam  today but would prefer it to be sent to her local pharmacy.  Prescription sent continue to work on using it sparingly.      Relevant Medications   ALPRAZolam  (XANAX ) 1 MG tablet    Is a candidate for lung cancer screening so did give her some additional handout and information about screening options.  Encouraged to get Tdap and shingles vaccines.    Return in about 6 months (around 12/31/2024) for Pre-diabetes, Mood.    Dorothyann Byars, MD

## 2024-06-30 NOTE — Assessment & Plan Note (Signed)
 Did have an appoint with the ENT but had to cancel it and has had 3 nosebleeds since then she has not rescheduled yet but encouraged her to do so.  She says they cottonball with Afrin trick has been helpful though she can usually get it to stop within about 10 minutes if she does that.

## 2024-07-21 ENCOUNTER — Ambulatory Visit (INDEPENDENT_AMBULATORY_CARE_PROVIDER_SITE_OTHER): Admitting: Sports Medicine

## 2024-07-21 ENCOUNTER — Encounter: Payer: Self-pay | Admitting: Sports Medicine

## 2024-07-21 DIAGNOSIS — M170 Bilateral primary osteoarthritis of knee: Secondary | ICD-10-CM | POA: Diagnosis not present

## 2024-07-21 NOTE — Assessment & Plan Note (Signed)
 Ann Guerrero returns, she is status post Orthovisc, we did steroid in the left knee at the last visit as well. [name] over 80% better, still has some trouble getting up and down stairs mostly due to weakness so we will add the resistance training protocol for older adults, otherwise she can return to see me as needed.

## 2024-07-21 NOTE — Progress Notes (Signed)
    Procedures performed today:    None.  Independent interpretation of notes and tests performed by another provider:   None.  Brief History, Exam, Impression, and Recommendations:    Primary osteoarthritis of both knees Favour returns, she is status post Orthovisc, we did steroid in the left knee at the last visit as well. [name] over 80% better, still has some trouble getting up and down stairs mostly due to weakness so we will add the resistance training protocol for older adults, otherwise she can return to see me as needed.    ____________________________________________ Debby PARAS. Curtis, M.D., ABFM., CAQSM., AME. Primary Care and Sports Medicine [place] [facility]  Adjunct Professor of [facility] Medicine  [employer]  Restaurant manager, fast food

## 2024-08-09 DIAGNOSIS — R040 Epistaxis: Secondary | ICD-10-CM | POA: Diagnosis not present

## 2024-08-09 DIAGNOSIS — J3489 Other specified disorders of nose and nasal sinuses: Secondary | ICD-10-CM | POA: Diagnosis not present

## 2024-08-30 ENCOUNTER — Encounter: Payer: Self-pay | Admitting: Sports Medicine

## 2024-10-06 ENCOUNTER — Ambulatory Visit: Payer: Self-pay

## 2024-10-06 NOTE — Telephone Encounter (Signed)
 Error in below note - appt is with Dr. Alvia

## 2024-10-06 NOTE — Telephone Encounter (Signed)
 Patient scheduled tomorrow 10/07/2024 with Dr. Alvan

## 2024-10-06 NOTE — Telephone Encounter (Signed)
 FYI Only or Action Required?: Action required by provider: request for appointment.  Patient was last seen in primary care on 07/21/2024 by Ann Debby PARAS, MD.  Called Nurse Triage reporting Rash.  Symptoms began several days ago.  Interventions attempted: OTC medications: Anti-itch cream, Ice/heat application, and Other: Frankincense oil and coconut oil.  Symptoms are: gradually worsening.  Triage Disposition: See Physician Within 24 Hours  Patient/caregiver understands and will follow disposition?: No, wishes to speak with PCP  Copied from CRM [id number]. Topic: Clinical - Red Word Triage >> Oct 06, 2024  1:40 PM Olam RAMAN wrote: Red Word that prompted transfer to Nurse Triage: pt thought she had posion oak on her face and face is swollen (right eye and below cheek bone) has sores  Reason for Disposition  [1] Looks infected (e.g., spreading redness, pus) AND [2] no fever  Answer Assessment - Initial Assessment Questions No appt availability in office within next 24 hrs with PCP. Pt declines appt with another provider, asking is she can be squeezed in by PCP. Relaying pt request to office. Advised pt to go to UC in the meantime and to call for worsening symptoms.  1. APPEARANCE of RASH: What does the rash look like? (e.g., blisters, dry flaky skin, red spots, redness, sores)     Redness and mild swelling, intact.  2. LOCATION: Where is the rash located?      Red spot next to right eye and cheekbone  3. NUMBER: How many spots are there?      2 spots, one next to right eye and other on right cheekbone.  4. SIZE: How big are the spots? (e.g., inches, cm; or compare to size of pinhead, tip of pen, eraser, pea)      Each spot the size of a quarter  5. ONSET: When did the rash start?      Monday  6. ITCHING: Does the rash itch? If Yes, ask: How bad is the itch?  (Scale 0-10; or none, mild, moderate, severe)     No  7. PAIN: Does the rash hurt? If Yes, ask: How  bad is the pain?  (Scale 0-10; or none, mild, moderate, severe)     No pain. Pt reports spots are sensitive/irritating  8. OTHER SYMPTOMS: Do you have any other symptoms? (e.g., fever)     Denies. No changes to vision, denies SOB. Pt denies coming into contact with Poison Oak. Has had it before and current symptoms don't feel like poison oak.  Protocols used: Rash or Redness - Localized-A-AH

## 2024-10-07 ENCOUNTER — Encounter: Payer: Self-pay | Admitting: Family Medicine

## 2024-10-07 ENCOUNTER — Ambulatory Visit: Admitting: Family Medicine

## 2024-10-07 VITALS — BP 112/71 | HR 71 | Ht 61.0 in | Wt 141.0 lb

## 2024-10-07 DIAGNOSIS — B029 Zoster without complications: Secondary | ICD-10-CM | POA: Diagnosis not present

## 2024-10-07 MED ORDER — VALACYCLOVIR HCL 1 G PO TABS: 1000.0000 mg | ORAL_TABLET | Freq: Three times a day (TID) | ORAL | 0 refills | Status: AC

## 2024-10-07 NOTE — Patient Instructions (Signed)
 Shingles  Shingles, or herpes zoster, is an infection. It gives you a skin rash and blisters. These infected areas may hurt a lot. Shingles only happens if: You've had chickenpox. You've been given a shot called a vaccine to protect you from getting chickenpox. Shingles is rare in this case. What are the causes? Shingles is caused by a germ called the varicella-zoster virus. This is the same germ that causes chickenpox. After you're exposed to the germ, it stays in your body but is dormant. This means it isn't active. Shingles happens if the germ becomes active again. This can happen years after you're first exposed to the germ. What increases the risk? You may be more likely to get shingles if: You're older than 70 years of age. You're under a lot of stress. You have a weak immune system. The immune system is your body's defense system. It may be weak if: You have human immunodeficiency virus (HIV). You have acquired immunodeficiency syndrome (AIDS). You have cancer. You take medicines that weaken your immune system. These include organ transplant medicines. What are the signs or symptoms? The first symptoms of shingles may be itching, tingling, or pain. Your skin may feel like it's burning. A few days or weeks later, you'll get a rash. Here's what you can expect: The rash is likely to be on one side of your body. The rash may be shaped like a belt or a band. Over time, it will turn into blisters filled with fluid. The blisters will break open and change into scabs. The scabs will dry up in about 2-3 weeks. You may also have: A fever. Chills. A headache. Nausea. How is this diagnosed? Shingles is diagnosed with a skin exam. A sample called a culture may be taken from one of your blisters and sent to a lab. This will show if you have shingles. How is this treated? The rash may last for several weeks. There's no cure for shingles, but your health care provider may give you medicines.  These medicines may: Help with pain. Help with itching. Help with irritation and swelling. Help you get better sooner. Help to prevent long-term problems. If the rash is on your face, you may need to see an eye doctor or an ear, nose, and throat (ENT) doctor. Follow these instructions at home: Medicines Take your medicines only as told by your provider. Put an anti-itch cream or numbing cream on the rash or blisters as told by your provider. Relieving itching and discomfort  To help with itching: Put cold, wet cloths called cold compresses on the rash or blisters. Take a cool bath. Try adding baking soda or dry oatmeal to the water. Do not bathe in hot water. Use calamine lotion on the rash or blisters. You can get this type of lotion at the store. Blister and rash care Keep your rash covered with a loose bandage. Wear loose clothes that don't rub on your rash. Take care of your rash as told by your provider. Make sure you: Wash your hands with soap and water for at least 20 seconds before and after you change your bandage. If you can't use soap and water, use hand sanitizer. Keep your rash and blisters clean by washing them with mild soap and cool water. Change your bandage. Check your rash every day for signs of infection. Check for: More redness, swelling, or pain. Fluid or blood. Warmth. Pus or a bad smell. Do not scratch your rash. Do not pick at your  blisters. To help you not scratch: Keep your fingernails clean and cut short. Try to wear gloves or mittens when you sleep. General instructions Rest. Wash your hands often with soap and water for at least 20 seconds. If you can't use soap and water, use hand sanitizer. Washing your hands lowers your chance of getting a skin infection. Your infection can cause chickenpox in others. If you have blisters that aren't scabs yet, stay away from: Babies. Pregnant people. Children who have eczema. Older people who have organ  transplants. People who have a long-term, or chronic, illness. Anyone who hasn't had chickenpox before. Anyone who hasn't gotten the chickenpox vaccine. How is this prevented? Vaccines are the best way to prevent you from getting chickenpox or shingles. Talk with your provider about getting these shots. Where to find more information Centers for Disease Control and Prevention (CDC): TonerProm[employer].no Contact a health care provider if: Your pain doesn't get better with medicine. Your pain doesn't get better after the rash heals. You have any signs of infection around the rash. Your rash or blisters get worse. You have a fever or chills. Get help right away if: The rash is on your face or nose. You have pain in your face or by your eye. You lose feeling on one side of your face. You have trouble seeing. You have ear pain or ringing in your ear. This information is not intended to replace advice given to you by your health care provider. Make sure you discuss any questions you have with your health care provider. Document Revised: 09/17/2023 Document Reviewed: 01/30/2023 Elsevier Patient Education  2024 [employer].
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
## 2024-10-07 NOTE — Assessment & Plan Note (Signed)
 Rash and symptoms in V2 distribution are consistent with shingles.  Only mild to moderate pain at this time.  Will start valtrex.  Red flags reviewed and handout given.

## 2024-10-13 ENCOUNTER — Encounter

## 2024-12-26 ENCOUNTER — Telehealth: Payer: Self-pay | Admitting: Family Medicine

## 2024-12-26 NOTE — Telephone Encounter (Unsigned)
 Copied from CRM #[id number]. Topic: Clinical - Medication Refill >> Dec 26, 2024 11:41 AM Gustabo D wrote: Medication: ALPRAZolam  (XANAX ) 1 MG tablet- needs it for sleep  Has the patient contacted their pharmacy? No  (Agent: If no, request that the patient contact the pharmacy for the refill. If patient does not wish to contact the pharmacy document the reason why and proceed with request.) (Agent: If yes, when and what did the pharmacy advise?)  This is the patient's preferred pharmacy:    [facility] [phone] - [place], [place] - [address] [place] [address] Phone: [phone] Fax: [phone]  Is this the correct pharmacy for this prescription? Yes If no, delete pharmacy and type the correct one.   Has the prescription been filled recently? No  Is the patient out of the medication? No but only has 3 left and needs it  Has the patient been seen for an appointment in the last year OR does the patient have an upcoming appointment? Yes  Can we respond through MyChart? No  Agent: Please be advised that Rx refills may take up to 3 business days. We ask that you follow-up with your pharmacy.

## 2024-12-28 ENCOUNTER — Other Ambulatory Visit: Payer: Self-pay | Admitting: Family Medicine

## 2024-12-28 DIAGNOSIS — F5101 Primary insomnia: Secondary | ICD-10-CM

## 2024-12-28 DIAGNOSIS — F411 Generalized anxiety disorder: Secondary | ICD-10-CM

## 2024-12-28 NOTE — Telephone Encounter (Unsigned)
 Copied from CRM #[id number]. Topic: Clinical - Medication Refill >> Dec 28, 2024  4:33 PM Santiya F wrote: Medication: ALPRAZolam  (XANAX ) 1 MG tablet [[id number]]  Has the patient contacted their pharmacy? Yes  (Agent: If yes, when and what did the pharmacy advise?) contact office   This is the patient's preferred pharmacy:   [phone] [place], [place] - [address] [place] [address] Phone: [phone] Fax: [phone]  Is this the correct pharmacy for this prescription? Yes If no, delete pharmacy and type the correct one.   Has the prescription been filled recently? Yes  Is the patient out of the medication? Yes  Has the patient been seen for an appointment in the last year OR does the patient have an upcoming appointment? Yes  Can we respond through MyChart? No  Agent: Please be advised that Rx refills may take up to 3 business days. We ask that you follow-up with your pharmacy.  Patient sent a request on 12/26/24 and her medication has not been sent in to the pharmacy. Patient says she will take her last pill tonight and then she will be out. Patient wants to know if this can be sent urgently.

## 2024-12-30 MED ORDER — ALPRAZOLAM 1 MG PO TABS: 1.0000 mg | ORAL_TABLET | Freq: Every evening | ORAL | 1 refills | Status: AC | PRN

## 2025-01-02 ENCOUNTER — Encounter: Payer: Self-pay | Admitting: Family Medicine

## 2025-01-02 ENCOUNTER — Ambulatory Visit (INDEPENDENT_AMBULATORY_CARE_PROVIDER_SITE_OTHER): Admitting: Family Medicine

## 2025-01-02 VITALS — BP 135/85 | HR 66 | Ht 61.0 in | Wt 142.0 lb

## 2025-01-02 DIAGNOSIS — F411 Generalized anxiety disorder: Secondary | ICD-10-CM

## 2025-01-02 DIAGNOSIS — R7301 Impaired fasting glucose: Secondary | ICD-10-CM | POA: Diagnosis not present

## 2025-01-02 DIAGNOSIS — J449 Chronic obstructive pulmonary disease, unspecified: Secondary | ICD-10-CM | POA: Diagnosis not present

## 2025-01-02 DIAGNOSIS — M810 Age-related osteoporosis without current pathological fracture: Secondary | ICD-10-CM

## 2025-01-02 LAB — POCT GLYCOSYLATED HEMOGLOBIN (HGB A1C): Hemoglobin A1C: 6.1 % — AB (ref 4.0–5.6)

## 2025-01-02 NOTE — Assessment & Plan Note (Signed)
 Evidence of COPD on prior chest xrays.  No recent flares.

## 2025-01-02 NOTE — Progress Notes (Signed)
 "  Established Patient Office Visit  Patient ID: Ann Guerrero, female    DOB: November 05, 1956  Age: 69 y.o. MRN: [id number] PCP: Alvan Ann BIRCH, MD  Chief Complaint  Patient presents with   ifg   mood    Subjective:     HPI  Discussed the use of AI scribe software for clinical note transcription with the patient, who gave verbal consent to proceed.  History of Present Illness TYE Guerrero is a 69 year old female who presents with insomnia and emotional eating.  Insomnia - Wakes at 1 AM despite feeling tired - Occasionally takes Tylenol to aid sleep - Cautious about Tylenol use due to concerns about hydration - Low water intake, only a cup and a half of coffee consumed on the day of visit  Emotional eating and weight gain - Engages in emotional eating attributed to stress - Recent weight gain - Hemoglobin A1c increased to 6.1, higher than previous levels  Panic attacks - Three episodes last week - Symptoms include difficulty breathing and sense of losing control - Alprazolam  available for use - Uses orange oil aromatherapy, which is helpful  Rhinorrhea - Sudden clear nasal discharge without congestion - Symptoms sometimes worsened by bending over     ROS    Objective:     BP 135/85   Pulse 66   Ht 5' 1 (1.549 m)   Wt 142 lb (64.4 kg)   SpO2 96%   BMI 26.83 kg/m    Physical Exam Vitals and nursing note reviewed.  Constitutional:      Appearance: Normal appearance.  HENT:     Head: Normocephalic and atraumatic.  Eyes:     Conjunctiva/sclera: Conjunctivae normal.  Cardiovascular:     Rate and Rhythm: Normal rate and regular rhythm.  Pulmonary:     Effort: Pulmonary effort is normal.     Breath sounds: Normal breath sounds.  Skin:    General: Skin is warm and dry.  Neurological:     Mental Status: [name] alert.  Psychiatric:        Mood and Affect: Mood normal.      Results for orders placed or performed in visit on 01/02/25   POCT HgB A1C  Result Value Ref Range   Hemoglobin A1C 6.1 (A) 4.0 - 5.6 %   HbA1c POC (<> result, manual entry)     HbA1c, POC (prediabetic range)     HbA1c, POC (controlled diabetic range)        The 10-year ASCVD risk score (Arnett DK, et al., 2019) is: 9.9%    Assessment & Plan:   Problem List Items Addressed This Visit       Respiratory   COPD (chronic obstructive pulmonary disease) (HCC)   Evidence of COPD on prior chest xrays.  No recent flares.          Endocrine   IFG (impaired fasting glucose) - Primary   Relevant Orders   POCT HgB A1C (Completed)   CMP14+EGFR   Lipid panel   TSH   CBC with Differential/Platelet     Musculoskeletal and Integument   Osteoporosis   Relevant Orders   CMP14+EGFR   Lipid panel   TSH   CBC with Differential/Platelet   VITAMIN D  25 Hydroxy (Vit-D Deficiency, Fractures)     Other   Anxiety state   Relevant Orders   CMP14+EGFR   Lipid panel   TSH   CBC with Differential/Platelet    Assessment and Plan  Assessment & Plan Impaired fasting glucose A1c increased to 6.1, indicating worsening glycemic control. Weight gain attributed to emotional eating. - Ordered blood work for further evaluation of glucose control.  Generalized anxiety disorder with panic attacks Experiencing panic attacks with dyspnea and feeling of lack of control. Alprazolam  available for acute management. Non-pharmacological strategies discussed. - Continue alprazolam  as needed for acute panic attacks. - Encouraged non-pharmacological strategies such as deep breathing and fresh air exposure to manage anxiety.  Insomnia Difficulty sleeping, particularly waking at 1 AM. Tylenol used occasionally for sleep, but not preferred due to potential side effects. Dehydration noted, which may contribute to sleep disturbances. - Encouraged adequate hydration to potentially improve sleep quality.    No follow-ups on file.    Ann Byars, MD [facility]  Primary Care & Sports Medicine at [facility]   "

## 2025-01-04 ENCOUNTER — Ambulatory Visit: Payer: Self-pay | Admitting: Family Medicine

## 2025-01-04 LAB — LIPID PANEL
Chol/HDL Ratio: 3.1 ratio (ref 0.0–4.4)
Cholesterol, Total: 192 mg/dL (ref 100–199)
HDL: 62 mg/dL
LDL Chol Calc (NIH): 114 mg/dL — ABNORMAL HIGH (ref 0–99)
Triglycerides: 91 mg/dL (ref 0–149)
VLDL Cholesterol Cal: 16 mg/dL (ref 5–40)

## 2025-01-04 LAB — CMP14+EGFR
ALT: 25 IU/L (ref 0–32)
AST: 27 IU/L (ref 0–40)
Albumin: 4.2 g/dL (ref 3.9–4.9)
Alkaline Phosphatase: 76 IU/L (ref 49–135)
BUN/Creatinine Ratio: 14 (ref 12–28)
BUN: 9 mg/dL (ref 8–27)
Bilirubin Total: 0.3 mg/dL (ref 0.0–1.2)
CO2: 26 mmol/L (ref 20–29)
Calcium: 9.2 mg/dL (ref 8.7–10.3)
Chloride: 101 mmol/L (ref 96–106)
Creatinine, Ser: 0.64 mg/dL (ref 0.57–1.00)
Globulin, Total: 2.4 g/dL (ref 1.5–4.5)
Glucose: 98 mg/dL (ref 70–99)
Potassium: 4.5 mmol/L (ref 3.5–5.2)
Sodium: 140 mmol/L (ref 134–144)
Total Protein: 6.6 g/dL (ref 6.0–8.5)
eGFR: 95 mL/min/1.73

## 2025-01-04 LAB — CBC WITH DIFFERENTIAL/PLATELET
Basophils Absolute: 0.1 x10E3/uL (ref 0.0–0.2)
Basos: 1 %
EOS (ABSOLUTE): 0.1 x10E3/uL (ref 0.0–0.4)
Eos: 2 %
Hematocrit: 44.9 % (ref 34.0–46.6)
Hemoglobin: 15.1 g/dL (ref 11.1–15.9)
Immature Grans (Abs): 0 x10E3/uL (ref 0.0–0.1)
Immature Granulocytes: 0 %
Lymphocytes Absolute: 1.8 x10E3/uL (ref 0.7–3.1)
Lymphs: 24 %
MCH: 31.4 pg (ref 26.6–33.0)
MCHC: 33.6 g/dL (ref 31.5–35.7)
MCV: 93 fL (ref 79–97)
Monocytes Absolute: 0.5 x10E3/uL (ref 0.1–0.9)
Monocytes: 7 %
Neutrophils Absolute: 5 x10E3/uL (ref 1.4–7.0)
Neutrophils: 65 %
Platelets: 306 x10E3/uL (ref 150–450)
RBC: 4.81 x10E6/uL (ref 3.77–5.28)
RDW: 12.6 % (ref 11.7–15.4)
WBC: 7.5 x10E3/uL (ref 3.4–10.8)

## 2025-01-04 LAB — TSH: TSH: 1.79 u[IU]/mL (ref 0.450–4.500)

## 2025-01-04 LAB — VITAMIN D 25 HYDROXY (VIT D DEFICIENCY, FRACTURES): Vit D, 25-Hydroxy: 24.6 ng/mL — ABNORMAL LOW (ref 30.0–100.0)

## 2025-01-04 NOTE — Progress Notes (Signed)
 Hi Tinley, LDL cholesterol just slightly elevated continue to work on healthy diet and regular exercise.  Your vitamin D  is low so make sure that you are taking 25 or 50 mcg daily.  But you do not need anything higher than that.  Metabolic panel including liver and kidney function looks great.  Thyroid  and blood count are normal.

## 2025-07-03 ENCOUNTER — Ambulatory Visit: Admitting: Family Medicine
